# Patient Record
Sex: Female | Born: 1950 | Race: White | Hispanic: No | Marital: Married | Smoking: Never smoker
Health system: Southern US, Community
[De-identification: ages and names within clinical notes are randomized; demographics above are authoritative.]

## PROBLEM LIST (undated history)

## (undated) DIAGNOSIS — E785 Hyperlipidemia, unspecified: Secondary | ICD-10-CM

## (undated) DIAGNOSIS — I10 Essential (primary) hypertension: Secondary | ICD-10-CM

## (undated) DIAGNOSIS — M1990 Unspecified osteoarthritis, unspecified site: Secondary | ICD-10-CM

## (undated) DIAGNOSIS — H25019 Cortical age-related cataract, unspecified eye: Secondary | ICD-10-CM

## (undated) DIAGNOSIS — C801 Malignant (primary) neoplasm, unspecified: Secondary | ICD-10-CM

## (undated) DIAGNOSIS — Z9889 Other specified postprocedural states: Secondary | ICD-10-CM

## (undated) DIAGNOSIS — R112 Nausea with vomiting, unspecified: Secondary | ICD-10-CM

## (undated) DIAGNOSIS — M8580 Other specified disorders of bone density and structure, unspecified site: Secondary | ICD-10-CM

## (undated) HISTORY — PX: TUBAL LIGATION: SHX77

## (undated) HISTORY — PX: ABDOMINAL HYSTERECTOMY: SHX81

---

## 1999-11-22 DIAGNOSIS — Z86018 Personal history of other benign neoplasm: Secondary | ICD-10-CM

## 1999-11-22 HISTORY — DX: Personal history of other benign neoplasm: Z86.018

## 2000-03-23 DIAGNOSIS — Z85828 Personal history of other malignant neoplasm of skin: Secondary | ICD-10-CM

## 2000-03-23 HISTORY — DX: Personal history of other malignant neoplasm of skin: Z85.828

## 2000-05-01 DIAGNOSIS — C801 Malignant (primary) neoplasm, unspecified: Secondary | ICD-10-CM

## 2000-05-01 HISTORY — DX: Malignant (primary) neoplasm, unspecified: C80.1

## 2004-11-12 ENCOUNTER — Ambulatory Visit: Payer: Self-pay | Admitting: Unknown Physician Specialty

## 2005-11-17 ENCOUNTER — Ambulatory Visit: Payer: Self-pay | Admitting: Unknown Physician Specialty

## 2006-11-22 ENCOUNTER — Ambulatory Visit: Payer: Self-pay | Admitting: Unknown Physician Specialty

## 2007-02-13 ENCOUNTER — Ambulatory Visit: Payer: Self-pay | Admitting: Gastroenterology

## 2007-11-29 ENCOUNTER — Ambulatory Visit: Payer: Self-pay | Admitting: Unknown Physician Specialty

## 2009-02-03 ENCOUNTER — Ambulatory Visit: Payer: Self-pay | Admitting: Unknown Physician Specialty

## 2010-02-08 ENCOUNTER — Ambulatory Visit: Payer: Self-pay | Admitting: Unknown Physician Specialty

## 2011-02-23 ENCOUNTER — Ambulatory Visit: Payer: Self-pay | Admitting: Unknown Physician Specialty

## 2012-04-11 ENCOUNTER — Ambulatory Visit: Payer: Self-pay | Admitting: Unknown Physician Specialty

## 2013-04-16 ENCOUNTER — Ambulatory Visit: Payer: Self-pay | Admitting: Unknown Physician Specialty

## 2014-05-28 ENCOUNTER — Ambulatory Visit: Payer: Self-pay | Admitting: Internal Medicine

## 2015-06-25 ENCOUNTER — Other Ambulatory Visit: Payer: Self-pay | Admitting: Internal Medicine

## 2015-06-25 DIAGNOSIS — Z1231 Encounter for screening mammogram for malignant neoplasm of breast: Secondary | ICD-10-CM

## 2015-06-26 ENCOUNTER — Ambulatory Visit
Admission: RE | Admit: 2015-06-26 | Discharge: 2015-06-26 | Disposition: A | Discharge status: Home / Self Care | Payer: BLUE CROSS/BLUE SHIELD | Source: Ambulatory Visit | Attending: Internal Medicine | Admitting: Internal Medicine

## 2015-06-26 DIAGNOSIS — Z1231 Encounter for screening mammogram for malignant neoplasm of breast: Secondary | ICD-10-CM | POA: Insufficient documentation

## 2015-06-26 HISTORY — DX: Malignant (primary) neoplasm, unspecified: C80.1

## 2015-08-11 ENCOUNTER — Encounter: Payer: Self-pay | Admitting: *Deleted

## 2015-08-13 ENCOUNTER — Ambulatory Visit: Payer: BLUE CROSS/BLUE SHIELD | Admitting: Certified Registered Nurse Anesthetist

## 2015-08-13 ENCOUNTER — Ambulatory Visit
Admission: RE | Admit: 2015-08-13 | Discharge: 2015-08-13 | Disposition: A | Discharge status: Home / Self Care | Payer: BLUE CROSS/BLUE SHIELD | Source: Ambulatory Visit | Attending: Ophthalmology | Admitting: Ophthalmology

## 2015-08-13 ENCOUNTER — Encounter
Admission: RE | Disposition: A | Discharge status: Home / Self Care | Payer: Self-pay | Source: Ambulatory Visit | Attending: Ophthalmology

## 2015-08-13 ENCOUNTER — Encounter: Payer: Self-pay | Admitting: *Deleted

## 2015-08-13 DIAGNOSIS — Z85828 Personal history of other malignant neoplasm of skin: Secondary | ICD-10-CM | POA: Diagnosis not present

## 2015-08-13 DIAGNOSIS — E780 Pure hypercholesterolemia: Secondary | ICD-10-CM | POA: Diagnosis not present

## 2015-08-13 DIAGNOSIS — H269 Unspecified cataract: Secondary | ICD-10-CM | POA: Diagnosis present

## 2015-08-13 DIAGNOSIS — Z7982 Long term (current) use of aspirin: Secondary | ICD-10-CM | POA: Insufficient documentation

## 2015-08-13 DIAGNOSIS — H2511 Age-related nuclear cataract, right eye: Secondary | ICD-10-CM | POA: Insufficient documentation

## 2015-08-13 DIAGNOSIS — Z79899 Other long term (current) drug therapy: Secondary | ICD-10-CM | POA: Insufficient documentation

## 2015-08-13 DIAGNOSIS — Z882 Allergy status to sulfonamides status: Secondary | ICD-10-CM | POA: Diagnosis not present

## 2015-08-13 DIAGNOSIS — I10 Essential (primary) hypertension: Secondary | ICD-10-CM | POA: Insufficient documentation

## 2015-08-13 DIAGNOSIS — T7840XA Allergy, unspecified, initial encounter: Secondary | ICD-10-CM | POA: Diagnosis not present

## 2015-08-13 DIAGNOSIS — Z90710 Acquired absence of both cervix and uterus: Secondary | ICD-10-CM | POA: Insufficient documentation

## 2015-08-13 HISTORY — DX: Nausea with vomiting, unspecified: R11.2

## 2015-08-13 HISTORY — DX: Essential (primary) hypertension: I10

## 2015-08-13 HISTORY — PX: CATARACT EXTRACTION W/PHACO: SHX586

## 2015-08-13 HISTORY — DX: Other specified postprocedural states: Z98.890

## 2015-08-13 SURGERY — PHACOEMULSIFICATION, CATARACT, WITH IOL INSERTION
Anesthesia: Monitor Anesthesia Care | Site: Eye | Laterality: Right

## 2015-08-13 MED ORDER — TETRACAINE HCL 0.5 % OP SOLN
1.0000 [drp] | OPHTHALMIC | Status: DC | PRN
  Administered 2015-08-13: 1 [drp] via OPHTHALMIC

## 2015-08-13 MED ORDER — SODIUM CHLORIDE 0.9 % IV SOLN
INTRAVENOUS | Status: DC
  Administered 2015-08-13: 09:00:00 via INTRAVENOUS

## 2015-08-13 MED ORDER — LIDOCAINE HCL (PF) 4 % IJ SOLN
INTRAMUSCULAR | Status: AC
  Filled 2015-08-13: qty 5

## 2015-08-13 MED ORDER — EPINEPHRINE HCL 1 MG/ML IJ SOLN
INTRAMUSCULAR | Status: AC
  Filled 2015-08-13: qty 1

## 2015-08-13 MED ORDER — MOXIFLOXACIN HCL 0.5 % OP SOLN
OPHTHALMIC | Status: AC
Start: 2015-08-13 — End: 2015-08-13
  Administered 2015-08-13: 1 [drp] via OPHTHALMIC
  Filled 2015-08-13: qty 3

## 2015-08-13 MED ORDER — FENTANYL CITRATE (PF) 100 MCG/2ML IJ SOLN
INTRAMUSCULAR | Status: DC | PRN
  Administered 2015-08-13: 50 ug via INTRAVENOUS

## 2015-08-13 MED ORDER — PHENYLEPHRINE HCL 10 % OP SOLN
1.0000 [drp] | OPHTHALMIC | Status: AC | PRN
  Administered 2015-08-13 (×4): 1 [drp] via OPHTHALMIC

## 2015-08-13 MED ORDER — NA HYALUR & NA CHOND-NA HYALUR 0.4-0.35 ML IO KIT
PACK | INTRAOCULAR | Status: DC | PRN
  Administered 2015-08-13: .5 mL via INTRAOCULAR

## 2015-08-13 MED ORDER — CEFUROXIME OPHTHALMIC INJECTION 1 MG/0.1 ML
INJECTION | OPHTHALMIC | Status: AC
  Filled 2015-08-13: qty 0.1

## 2015-08-13 MED ORDER — NA HYALUR & NA CHOND-NA HYALUR 0.55-0.5 ML IO KIT
PACK | INTRAOCULAR | Status: AC
  Filled 2015-08-13: qty 1.05

## 2015-08-13 MED ORDER — PHENYLEPHRINE HCL 10 % OP SOLN
OPHTHALMIC | Status: AC
  Administered 2015-08-13: 1 [drp] via OPHTHALMIC
  Filled 2015-08-13: qty 5

## 2015-08-13 MED ORDER — CEFUROXIME OPHTHALMIC INJECTION 1 MG/0.1 ML
INJECTION | OPHTHALMIC | Status: DC | PRN
  Administered 2015-08-13: 0.1 mL via INTRACAMERAL

## 2015-08-13 MED ORDER — LIDOCAINE HCL (PF) 4 % IJ SOLN
INTRAOCULAR | Status: DC | PRN
  Administered 2015-08-13: 4 mL via OPHTHALMIC

## 2015-08-13 MED ORDER — CYCLOPENTOLATE HCL 2 % OP SOLN
OPHTHALMIC | Status: AC
  Administered 2015-08-13: 1 [drp] via OPHTHALMIC
  Filled 2015-08-13: qty 2

## 2015-08-13 MED ORDER — MIDAZOLAM HCL 2 MG/2ML IJ SOLN
INTRAMUSCULAR | Status: DC | PRN
  Administered 2015-08-13: 1 mg via INTRAVENOUS

## 2015-08-13 MED ORDER — TETRACAINE HCL 0.5 % OP SOLN
OPHTHALMIC | Status: AC
  Administered 2015-08-13: 1 [drp] via OPHTHALMIC
  Filled 2015-08-13: qty 2

## 2015-08-13 MED ORDER — CYCLOPENTOLATE HCL 2 % OP SOLN
1.0000 [drp] | OPHTHALMIC | Status: AC | PRN
  Administered 2015-08-13 (×4): 1 [drp] via OPHTHALMIC

## 2015-08-13 MED ORDER — MOXIFLOXACIN HCL 0.5 % OP SOLN
1.0000 [drp] | OPHTHALMIC | Status: AC | PRN
  Administered 2015-08-13 (×3): 1 [drp] via OPHTHALMIC

## 2015-08-13 MED ORDER — MOXIFLOXACIN HCL 0.5 % OP SOLN
OPHTHALMIC | Status: DC | PRN
  Administered 2015-08-13: 1 [drp] via OPHTHALMIC

## 2015-08-13 SURGICAL SUPPLY — 22 items
CANNULA ANT/CHMB 27GA (MISCELLANEOUS) ×3 IMPLANT
CUP MEDICINE 2OZ PLAST GRAD ST (MISCELLANEOUS) ×3 IMPLANT
GLOVE BIO SURGEON STRL SZ7 (GLOVE) ×3 IMPLANT
GLOVE SURG LX 6.5 MICRO (GLOVE) ×4
GLOVE SURG LX STRL 6.5 MICRO (GLOVE) ×2 IMPLANT
GOWN STRL REUS W/ TWL LRG LVL3 (GOWN DISPOSABLE) ×2 IMPLANT
GOWN STRL REUS W/TWL LRG LVL3 (GOWN DISPOSABLE) ×4
LENS IOL ACRSF IQ PC 19.5 (Intraocular Lens) ×1 IMPLANT
LENS IOL ACRYSOF IQ POST 19.5 (Intraocular Lens) ×3 IMPLANT
NEEDLE FILTER BLUNT 18X 1/2SAF (NEEDLE) ×2
NEEDLE FILTER BLUNT 18X1 1/2 (NEEDLE) ×1 IMPLANT
PACK CATARACT (MISCELLANEOUS) ×3 IMPLANT
PACK CATARACT BRASINGTON LX (MISCELLANEOUS) ×3 IMPLANT
PACK EYE AFTER SURG (MISCELLANEOUS) ×3 IMPLANT
SOL BSS BAG (MISCELLANEOUS) ×3
SOL PREP PVP 2OZ (MISCELLANEOUS) ×3
SOLUTION BSS BAG (MISCELLANEOUS) ×1 IMPLANT
SOLUTION PREP PVP 2OZ (MISCELLANEOUS) ×1 IMPLANT
SYR 3ML LL SCALE MARK (SYRINGE) ×6 IMPLANT
SYR TB 1ML 27GX1/2 LL (SYRINGE) ×3 IMPLANT
WATER STERILE IRR 1000ML POUR (IV SOLUTION) ×3 IMPLANT
WIPE NON LINTING 3.25X3.25 (MISCELLANEOUS) ×3 IMPLANT

## 2015-08-13 NOTE — Op Note (Signed)
  08/13/2015  PRE-OP DIAGNOSIS: Cataract (ICD-10 H25.11) Nuclear sclerotic catarct, RIGHT EYE  Post operative diagnosis: Cataract (ICD-10 H25.11) Nuclear sclerotic cataract, RIGHT EYE  Procedure: Phacoemulsification with introcular lens [id number]([id number])   SURGEON: Surgeon(s) and Role:    * Lyla Glassing, MD - Primary  ANESTHESIA: Choice   ESTIMATED BLOOD LOSS: MINIMAL  COMPLICATIONS: None  OPERATIVE DESCRIPTION:   Therapeutic options were discussed with the patient preoperatively, including a discussion of risks and benefits of surgery.  Informed consent was obtained. A dilated fundus exam was performed within 6 months.   The patient was premedicated and brought to the operating room and placed on the operating table in the supine position.  Topical tetracaine was instilled.  After adequate anesthesia, the patient was prepped and draped in the usual fashion.  A wire lid speculum was inserted and the microscope was positioned.  A sideport was used to create a paracentesis site and a mixture of preservative-free lidocaine, BSS, and epinephrine was was instilled into the anterior chamber, followed by viscoelastic.  A clear corneal incision was created using a keratome blade.  Capsulorrhexis was then performed.  In situ phacoemulsification was performed.  Cortical material was removed with the irrigation-aspiration unit.  Viscoelastic was instilled to open the capsular bag.  A posterior chamber intraocular lens, model 19.5 diopters, was inserted and positioned.  Irrigation-aspiration was used to remove all viscoelastic. Intracameral cefuroxime was injected into the eye. Wounds were checked for leakage and confirmed to be secure.  Lid speculum was removed and a shield was placed over the eye.  Patient was returned to the recovery room in stable condition. IMPLANTS:   Implant Name Type Inv. Item Serial No. Manufacturer Lot No. LRB No. Used  IMPLANT LENS - [id number] Intraocular Lens IMPLANT LENS  [id number] ALCON   Right 1     Postoperative care and discharge medication counseling was discussed with the patient or the parents prior to discharge

## 2015-08-13 NOTE — Anesthesia Procedure Notes (Signed)
Procedure Name: MAC Performed by: BEANE, CATHERINE Pre-anesthesia Checklist: Patient identified, Emergency Drugs available, Patient being monitored, Suction available and Timeout performed Oxygen Delivery Method: Nasal cannula       

## 2015-08-13 NOTE — Anesthesia Postprocedure Evaluation (Signed)
  Anesthesia Post-op Note  Patient: Chelsea Patton  Procedure(s) Performed: Procedure(s) with comments: CATARACT EXTRACTION PHACO AND INTRAOCULAR LENS PLACEMENT (IOC) (Right) - LOT # [id number] H US:01:50.8 AP:17.4 CDE:19.28  Anesthesia type:MAC  Patient location: PACU  Post pain: Pain level controlled  Post assessment: Post-op Vital signs reviewed, Patient's Cardiovascular Status Stable, Respiratory Function Stable, Patent Airway and No signs of Nausea or vomiting  Post vital signs: Reviewed and stable  Last Vitals:  Filed Vitals:   08/13/15 1028  BP: 126/65  Pulse: 88  Temp: 36.1 C  Resp: 18    Level of consciousness: awake, alert  and patient cooperative  Complications: No apparent anesthesia complications

## 2015-08-13 NOTE — Discharge Instructions (Signed)
Post Operative Instructions  Drop Schedule: Place 1 drop of Durezol in operative eye twice daily Place 1 drop of Ilevro in operative eye once daily Place 1 drop of Vigamox in operative eye four times daily  USE ONLY ONE DROP AT A TIME, WAIT 5 MINUTES BETWEEN DROPS  Eye Protection:  - Do not  rub or squeeze your eyes for one full week. - no strenuous activity or lifting more than 20 pounds during the first week - no hot tubs/saunas/swimming pools for two weeks after surgery - do not get water into your eye (washing your face is permitted but avoid water directly into the eye) - Ensure that you wear the eye shield every night for the first week after surgery   When to call:  It is unusual to have pain following cataract surgery. Pain that is not relieved with Tylenol or Ibuprofen is a reason to call immediately. Vision gradually clears during the first days after surgery; however vision should not get worse or darken. Please call with any unusual symptoms. lights, new floaters, or a curtain over the vision should prompt a phone call.    AMBULATORY SURGERY  DISCHARGE INSTRUCTIONS   1) The drugs that you were given will stay in your system until tomorrow so for the next 24 hours you should not:  A) Drive an automobile B) Make any legal decisions C) Drink any alcoholic beverage   2) You may resume regular meals tomorrow.  Today it is better to start with liquids and gradually work up to solid foods.  You may eat anything you prefer, but it is better to start with liquids, then soup and crackers, and gradually work up to solid foods.   3) Please notify your doctor immediately if you have any unusual bleeding, trouble breathing, redness and pain at the surgery site, drainage, fever, or pain not relieved by medication.    4) Additional Instructions:        Please contact your physician with any problems or Same Day Surgery at [phone], Monday through Friday 6 am to 4 pm,  or [place] at [facility] number at [phone].AMBULATORY SURGERY  DISCHARGE INSTRUCTIONS   5) The drugs that you were given will stay in your system until tomorrow so for the next 24 hours you should not:  D) Drive an automobile E) Make any legal decisions F) Drink any alcoholic beverage   6) You may resume regular meals tomorrow.  Today it is better to start with liquids and gradually work up to solid foods.  You may eat anything you prefer, but it is better to start with liquids, then soup and crackers, and gradually work up to solid foods.   7) Please notify your doctor immediately if you have any unusual bleeding, trouble breathing, redness and pain at the surgery site, drainage, fever, or pain not relieved by medication.    8) Additional Instructions:        Please contact your physician with any problems or Same Day Surgery at [phone], Monday through Friday 6 am to 4 pm, or [place] at [facility] number at [phone].

## 2015-08-13 NOTE — Anesthesia Preprocedure Evaluation (Signed)
Anesthesia Evaluation  Patient identified by MRN, date of birth, ID band Patient awake    Reviewed: Allergy & Precautions, H&P , NPO status , Patient's Chart, lab work & pertinent test results, reviewed documented beta blocker date and time   History of Anesthesia Complications (+) PONV and history of anesthetic complications  Airway Mallampati: II  TM Distance: >3 FB Neck ROM: full    Dental no notable dental hx. (+) Teeth Intact   Pulmonary neg pulmonary ROS,    Pulmonary exam normal breath sounds clear to auscultation       Cardiovascular Exercise Tolerance: Good hypertension, On Medications (-) angina(-) CAD, (-) Past MI, (-) Cardiac Stents and (-) CABG Normal cardiovascular exam(-) dysrhythmias (-) Valvular Problems/Murmurs Rhythm:regular Rate:Normal     Neuro/Psych negative neurological ROS  negative psych ROS   GI/Hepatic negative GI ROS, Neg liver ROS,   Endo/Other  negative endocrine ROS  Renal/GU negative Renal ROS  negative genitourinary   Musculoskeletal   Abdominal   Peds  Hematology negative hematology ROS (+)   Anesthesia Other Findings Past Medical History:   Cancer                                                         Comment:basal cell on nose   Hypertension                                                 PONV (postoperative nausea and vomiting)                     Reproductive/Obstetrics negative OB ROS                             Anesthesia Physical Anesthesia Plan  ASA: II  Anesthesia Plan: MAC   Post-op Pain Management:    Induction:   Airway Management Planned:   Additional Equipment:   Intra-op Plan:   Post-operative Plan:   Informed Consent: I have reviewed the patients History and Physical, chart, labs and discussed the procedure including the risks, benefits and alternatives for the proposed anesthesia with the patient or authorized  representative who has indicated his/her understanding and acceptance.   Dental Advisory Given  Plan Discussed with: Anesthesiologist, CRNA and Surgeon  Anesthesia Plan Comments:         Anesthesia Quick Evaluation

## 2015-08-13 NOTE — H&P (Signed)
The history and physical was faxed to the hospital. The history and physical was reviewed by me and no changes have occurred.   

## 2015-08-13 NOTE — Transfer of Care (Signed)
Immediate Anesthesia Transfer of Care Note  Patient: Chelsea Patton  Procedure(s) Performed: Procedure(s) with comments: CATARACT EXTRACTION PHACO AND INTRAOCULAR LENS PLACEMENT (IOC) (Right) - LOT # [id number] H US:01:50.8 AP:17.4 CDE:19.28  Patient Location: PACU  Anesthesia Type:MAC  Level of Consciousness: awake, alert  and oriented  Airway & Oxygen Therapy: Patient Spontanous Breathing  Post-op Assessment: Report given to RN and Post -op Vital signs reviewed and stable  Post vital signs: Reviewed and stable  Last Vitals:  Filed Vitals:   08/13/15 1028  BP: 126/65  Pulse: 88  Temp: 36.1 C  Resp: 18    Complications: No apparent anesthesia complications

## 2016-07-05 ENCOUNTER — Other Ambulatory Visit: Payer: Self-pay | Admitting: Internal Medicine

## 2016-07-05 DIAGNOSIS — Z1239 Encounter for other screening for malignant neoplasm of breast: Secondary | ICD-10-CM

## 2016-08-02 ENCOUNTER — Ambulatory Visit
Admission: RE | Admit: 2016-08-02 | Discharge: 2016-08-02 | Disposition: A | Discharge status: Home / Self Care | Payer: BLUE CROSS/BLUE SHIELD | Source: Ambulatory Visit | Attending: Internal Medicine | Admitting: Internal Medicine

## 2016-08-02 ENCOUNTER — Other Ambulatory Visit: Payer: Self-pay | Admitting: Internal Medicine

## 2016-08-02 DIAGNOSIS — Z1231 Encounter for screening mammogram for malignant neoplasm of breast: Secondary | ICD-10-CM | POA: Diagnosis not present

## 2016-08-02 DIAGNOSIS — Z1239 Encounter for other screening for malignant neoplasm of breast: Secondary | ICD-10-CM

## 2017-05-04 ENCOUNTER — Encounter: Payer: Self-pay | Admitting: *Deleted

## 2017-05-11 NOTE — Discharge Instructions (Signed)

## 2017-05-16 ENCOUNTER — Ambulatory Visit
Admission: RE | Admit: 2017-05-16 | Discharge: 2017-05-16 | Disposition: A | Discharge status: Home / Self Care | Payer: BLUE CROSS/BLUE SHIELD | Source: Ambulatory Visit | Attending: Ophthalmology | Admitting: Ophthalmology

## 2017-05-16 ENCOUNTER — Ambulatory Visit: Payer: BLUE CROSS/BLUE SHIELD | Admitting: Anesthesiology

## 2017-05-16 ENCOUNTER — Encounter
Admission: RE | Disposition: A | Discharge status: Home / Self Care | Payer: Self-pay | Source: Ambulatory Visit | Attending: Ophthalmology

## 2017-05-16 DIAGNOSIS — I10 Essential (primary) hypertension: Secondary | ICD-10-CM | POA: Insufficient documentation

## 2017-05-16 DIAGNOSIS — H2512 Age-related nuclear cataract, left eye: Secondary | ICD-10-CM | POA: Insufficient documentation

## 2017-05-16 DIAGNOSIS — N809 Endometriosis, unspecified: Secondary | ICD-10-CM | POA: Insufficient documentation

## 2017-05-16 HISTORY — PX: CATARACT EXTRACTION W/PHACO: SHX586

## 2017-05-16 SURGERY — PHACOEMULSIFICATION, CATARACT, WITH IOL INSERTION
Anesthesia: Monitor Anesthesia Care | Laterality: Left | Wound class: Clean

## 2017-05-16 MED ORDER — LIDOCAINE HCL (PF) 2 % IJ SOLN
INTRAMUSCULAR | Status: DC | PRN
  Administered 2017-05-16: 1 mL via INTRAOCULAR

## 2017-05-16 MED ORDER — FENTANYL CITRATE (PF) 100 MCG/2ML IJ SOLN
INTRAMUSCULAR | Status: DC | PRN
  Administered 2017-05-16: 50 ug via INTRAVENOUS

## 2017-05-16 MED ORDER — MOXIFLOXACIN HCL 0.5 % OP SOLN
OPHTHALMIC | Status: DC | PRN
  Administered 2017-05-16: 0.2 mL

## 2017-05-16 MED ORDER — ARMC OPHTHALMIC DILATING DROPS
1.0000 "application " | OPHTHALMIC | Status: DC | PRN
  Administered 2017-05-16 (×3): 1 via OPHTHALMIC

## 2017-05-16 MED ORDER — EPINEPHRINE PF 1 MG/ML IJ SOLN
INTRAOCULAR | Status: DC | PRN
  Administered 2017-05-16: 84 mL via OPHTHALMIC

## 2017-05-16 MED ORDER — LACTATED RINGERS IV SOLN: 1000.0000 mL | INTRAVENOUS | Status: DC

## 2017-05-16 MED ORDER — SODIUM HYALURONATE 23 MG/ML IO SOLN
INTRAOCULAR | Status: DC | PRN
  Administered 2017-05-16: 0.6 mL via INTRAOCULAR

## 2017-05-16 MED ORDER — MIDAZOLAM HCL 2 MG/2ML IJ SOLN
INTRAMUSCULAR | Status: DC | PRN
  Administered 2017-05-16: 2 mg via INTRAVENOUS

## 2017-05-16 MED ORDER — SODIUM HYALURONATE 10 MG/ML IO SOLN
INTRAOCULAR | Status: DC | PRN
  Administered 2017-05-16: 0.55 mL via INTRAOCULAR

## 2017-05-16 SURGICAL SUPPLY — 16 items
CANNULA ANT/CHMB 27GA (MISCELLANEOUS) ×3 IMPLANT
DISSECTOR HYDRO NUCLEUS 50X22 (MISCELLANEOUS) ×3 IMPLANT
GLOVE BIO SURGEON STRL SZ8 (GLOVE) ×3 IMPLANT
GLOVE SURG LX 7.5 STRW (GLOVE) ×2
GLOVE SURG LX STRL 7.5 STRW (GLOVE) ×1 IMPLANT
GOWN STRL REUS W/ TWL LRG LVL3 (GOWN DISPOSABLE) ×2 IMPLANT
GOWN STRL REUS W/TWL LRG LVL3 (GOWN DISPOSABLE) ×4
LENS IOL TECNIS ITEC 18.0 (Intraocular Lens) ×3 IMPLANT
MARKER SKIN DUAL TIP RULER LAB (MISCELLANEOUS) ×3 IMPLANT
PACK CATARACT (MISCELLANEOUS) ×3 IMPLANT
PACK DR. KING ARMS (PACKS) ×3 IMPLANT
PACK EYE AFTER SURG (MISCELLANEOUS) ×3 IMPLANT
SYR 3ML LL SCALE MARK (SYRINGE) ×3 IMPLANT
SYR TB 1ML LUER SLIP (SYRINGE) ×3 IMPLANT
WATER STERILE IRR 500ML POUR (IV SOLUTION) ×3 IMPLANT
WIPE NON LINTING 3.25X3.25 (MISCELLANEOUS) ×3 IMPLANT

## 2017-05-16 NOTE — Anesthesia Postprocedure Evaluation (Signed)
Anesthesia Post Note  Patient: Chelsea Patton  Procedure(s) Performed: Procedure(s) (LRB): CATARACT EXTRACTION PHACO AND INTRAOCULAR LENS PLACEMENT (IOC)  left (Left)  Patient location during evaluation: PACU Anesthesia Type: MAC Level of consciousness: awake Pain management: pain level controlled Vital Signs Assessment: post-procedure vital signs reviewed and stable Respiratory status: spontaneous breathing Cardiovascular status: blood pressure returned to baseline Postop Assessment: no headache Anesthetic complications: no    Lavonna Monarch

## 2017-05-16 NOTE — Transfer of Care (Signed)
Immediate Anesthesia Transfer of Care Note  Patient: Chelsea Patton  Procedure(s) Performed: Procedure(s): CATARACT EXTRACTION PHACO AND INTRAOCULAR LENS PLACEMENT (IOC)  left (Left)  Patient Location: PACU  Anesthesia Type: MAC  Level of Consciousness: awake, alert  and patient cooperative  Airway and Oxygen Therapy: Patient Spontanous Breathing and Patient connected to supplemental oxygen  Post-op Assessment: Post-op Vital signs reviewed, Patient's Cardiovascular Status Stable, Respiratory Function Stable, Patent Airway and No signs of Nausea or vomiting  Post-op Vital Signs: Reviewed and stable  Complications: No apparent anesthesia complications

## 2017-05-16 NOTE — H&P (Signed)
The History and Physical notes are on paper, have been signed, and are to be scanned.   I have examined the patient and there are no changes to the H&P.   [name] 05/16/2017 8:50 AM

## 2017-05-16 NOTE — Op Note (Signed)
OPERATIVE NOTE  Chelsea Patton [id number] 05/16/2017   PREOPERATIVE DIAGNOSIS:  Nuclear sclerotic cataract left eye.  H25.12   POSTOPERATIVE DIAGNOSIS:    Nuclear sclerotic cataract left eye.     PROCEDURE:  Phacoemusification with posterior chamber intraocular lens placement of the left eye   LENS:   Implant Name Type Inv. Item Serial No. Manufacturer Lot No. LRB No. Used  LENS IOL DIOP 18.0 - [id number] Intraocular Lens LENS IOL DIOP 18.0 [id number]   Left 1       PCB00 +18.0   ULTRASOUND TIME: 0 minutes 53 seconds.  CDE 8.58   SURGEON:  [name], MD, MPH   ANESTHESIA:  Topical with tetracaine drops augmented with 1% preservative-free intracameral lidocaine.  ESTIMATED BLOOD LOSS: <1 mL   COMPLICATIONS:  None.   DESCRIPTION OF PROCEDURE:  The patient was identified in the holding room and transported to the operating room and placed in the supine position under the operating microscope.  The left eye was identified as the operative eye and it was prepped and draped in the usual sterile ophthalmic fashion.   A 1.0 millimeter clear-corneal paracentesis was made at the 5:00 position. 0.5 ml of preservative-free 1% lidocaine with epinephrine was injected into the anterior chamber.  The anterior chamber was filled with Healon 5 viscoelastic.  A 2.4 millimeter keratome was used to make a near-clear corneal incision at the 2:00 position.  A curvilinear capsulorrhexis was made with a cystotome and capsulorrhexis forceps.  Balanced salt solution was used to hydrodissect and hydrodelineate the nucleus.   Phacoemulsification was then used in stop and chop fashion to remove the lens nucleus and epinucleus.  The remaining cortex was then removed using the irrigation and aspiration handpiece. Healon was then placed into the capsular bag to distend it for lens placement.  A lens was then injected into the capsular bag.  The remaining viscoelastic was aspirated.   Wounds were hydrated  with balanced salt solution.  The anterior chamber was inflated to a physiologic pressure with balanced salt solution.  Intracameral vigamox 0.1 mL undiltued was injected into the eye and a drop placed onto the ocular surface.  No wound leaks were noted.  The patient was taken to the recovery room in stable condition without complications of anesthesia or surgery  [name] 05/16/2017, 9:29 AM

## 2017-05-16 NOTE — Anesthesia Preprocedure Evaluation (Addendum)
Anesthesia Evaluation  Patient identified by MRN, date of birth, ID band Patient awake    Reviewed: Allergy & Precautions, NPO status , Patient's Chart, lab work & pertinent test results, reviewed documented beta blocker date and time   History of Anesthesia Complications (+) PONV  Airway Mallampati: II  TM Distance: >3 FB Neck ROM: Full    Dental no notable dental hx.    Pulmonary neg pulmonary ROS,    Pulmonary exam normal breath sounds clear to auscultation       Cardiovascular hypertension, Normal cardiovascular exam Rhythm:Regular Rate:Normal     Neuro/Psych negative neurological ROS     GI/Hepatic negative GI ROS, Neg liver ROS,   Endo/Other  negative endocrine ROS  Renal/GU negative Renal ROS  negative genitourinary   Musculoskeletal negative musculoskeletal ROS (+)   Abdominal Normal abdominal exam  (+)  Abdomen: soft.    Peds  Hematology negative hematology ROS (+)   Anesthesia Other Findings   Reproductive/Obstetrics Endometriosis                            Anesthesia Physical Anesthesia Plan  ASA: II  Anesthesia Plan: MAC   Post-op Pain Management:    Induction:   PONV Risk Score and Plan:   Airway Management Planned: Nasal Cannula  Additional Equipment: None  Intra-op Plan:   Post-operative Plan:   Informed Consent: I have reviewed the patients History and Physical, chart, labs and discussed the procedure including the risks, benefits and alternatives for the proposed anesthesia with the patient or authorized representative who has indicated his/her understanding and acceptance.     Plan Discussed with: CRNA, Anesthesiologist and Surgeon  Anesthesia Plan Comments:         Anesthesia Quick Evaluation

## 2017-05-16 NOTE — Anesthesia Procedure Notes (Signed)
Procedure Name: MAC Performed by: Bryonna Sundby Pre-anesthesia Checklist: Patient identified, Emergency Drugs available, Suction available, Timeout performed and Patient being monitored Patient Re-evaluated:Patient Re-evaluated prior to inductionOxygen Delivery Method: Nasal cannula Placement Confirmation: positive ETCO2     

## 2017-05-17 ENCOUNTER — Encounter: Payer: Self-pay | Admitting: Ophthalmology

## 2017-08-09 ENCOUNTER — Other Ambulatory Visit: Payer: Self-pay | Admitting: Internal Medicine

## 2017-08-09 DIAGNOSIS — Z1231 Encounter for screening mammogram for malignant neoplasm of breast: Secondary | ICD-10-CM

## 2017-08-18 ENCOUNTER — Ambulatory Visit
Admission: RE | Admit: 2017-08-18 | Discharge: 2017-08-18 | Disposition: A | Discharge status: Home / Self Care | Payer: BLUE CROSS/BLUE SHIELD | Source: Ambulatory Visit | Attending: Internal Medicine | Admitting: Internal Medicine

## 2017-08-18 DIAGNOSIS — Z1231 Encounter for screening mammogram for malignant neoplasm of breast: Secondary | ICD-10-CM | POA: Diagnosis not present

## 2017-09-01 ENCOUNTER — Other Ambulatory Visit: Payer: Self-pay | Admitting: Internal Medicine

## 2017-09-01 DIAGNOSIS — R3129 Other microscopic hematuria: Secondary | ICD-10-CM

## 2017-09-12 ENCOUNTER — Ambulatory Visit (INDEPENDENT_AMBULATORY_CARE_PROVIDER_SITE_OTHER): Payer: BLUE CROSS/BLUE SHIELD | Admitting: Urology

## 2017-09-12 ENCOUNTER — Encounter: Payer: Self-pay | Admitting: Urology

## 2017-09-12 VITALS — BP 133/83 | HR 98 | Ht 63.0 in | Wt 122.4 lb

## 2017-09-12 DIAGNOSIS — R3129 Other microscopic hematuria: Secondary | ICD-10-CM

## 2017-09-12 LAB — MICROSCOPIC EXAMINATION
BACTERIA UA: NONE SEEN
Epithelial Cells (non renal): NONE SEEN /hpf (ref 0–10)
WBC UA: NONE SEEN /HPF (ref 0–?)

## 2017-09-12 LAB — URINALYSIS, COMPLETE
Bilirubin, UA: NEGATIVE
Glucose, UA: NEGATIVE
Ketones, UA: NEGATIVE
Nitrite, UA: NEGATIVE
PH UA: 7 (ref 5.0–7.5)
PROTEIN UA: NEGATIVE
Specific Gravity, UA: 1.015 (ref 1.005–1.030)
UUROB: 0.2 mg/dL (ref 0.2–1.0)

## 2017-09-12 NOTE — Progress Notes (Signed)
09/12/2017 8:30 AM   Chelsea Patton 09-09-1951 [id number]  Referring provider: Glendon Axe, MD [facility] [place], [place] [address]  HPI:  1. Microscopic hematuria - blood on UA noted x several by PCP, GYN for over 40 years. No gross episodes. Non smoker. No chronic solvent exposure. No severre HTN or sig proteinuria. She has been through at least 4 prior negative office cystoscopies and several CTs and renal US's per report that have been unremarkable.   PMH sig for benign hyst, HTN, HLD.  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  Her PCP is Glendon Axe MD with [facility].  Today "Chelsea Patton" is seen as new patient for above, referred by dr. Candiss Norse.    PMH: Past Medical History:  Diagnosis Date  . Cancer ([place])    basal cell on nose  . Hypertension   . PONV (postoperative nausea and vomiting)     Surgical History: Past Surgical History:  Procedure Laterality Date  . ABDOMINAL HYSTERECTOMY    . CATARACT EXTRACTION W/PHACO Right 08/13/2015   Procedure: CATARACT EXTRACTION PHACO AND INTRAOCULAR LENS PLACEMENT (IOC);  Surgeon: Lyla Glassing, MD;  Location: [facility] ORS;  Service: Ophthalmology;  Laterality: Right;  LOT # [id number] H US:01:50.8 AP:17.4 CDE:19.28  . CATARACT EXTRACTION W/PHACO Left 05/16/2017   Procedure: CATARACT EXTRACTION PHACO AND INTRAOCULAR LENS PLACEMENT ([place])  left;  Surgeon: Eulogio Bear, MD;  Location: [facility];  Service: Ophthalmology;  Laterality: Left;  . TUBAL LIGATION      Home Medications:  Allergies as of 09/12/2017      Reactions   Codeine Nausea Only   Sulfa Antibiotics Rash      Medication List       Accurate as of 09/12/17  8:30 AM. Always use your most recent med list.          acetaminophen 325 MG tablet Commonly known as:  TYLENOL Take 650 mg by mouth every 6 (six) hours as needed.   aspirin EC 81 MG tablet Take 81 mg by mouth daily.   calcium carbonate 1250 (500 Ca)  MG tablet Commonly known as:  OS-CAL - dosed in mg of elemental calcium Take 1 tablet by mouth.   cholecalciferol 400 units Tabs tablet Commonly known as:  VITAMIN D Take 600 Units by mouth.   diphenhydrAMINE 25 MG tablet Commonly known as:  BENADRYL Take 25 mg by mouth every 6 (six) hours as needed.   hydrochlorothiazide 25 MG tablet Commonly known as:  HYDRODIURIL Take 25 mg by mouth daily.   ibuprofen 200 MG tablet Commonly known as:  ADVIL,MOTRIN Take 200 mg by mouth every 6 (six) hours as needed.   rosuvastatin 10 MG tablet Commonly known as:  CRESTOR Take 10 mg by mouth daily.   SYSTANE 0.4-0.3 % Soln Generic drug:  Polyethyl Glycol-Propyl Glycol Apply to eye.       Allergies:  Allergies  Allergen Reactions  . Codeine Nausea Only  . Sulfa Antibiotics Rash    Family History: Family History  Problem Relation Age of Onset  . Breast cancer Maternal Grandmother 82    Social History:  reports that she has never smoked. She has never used smokeless tobacco. She reports that she does not drink alcohol. Her drug history is not on file.     Review of Systems  Gastrointestinal (upper)  : Negative for upper GI symptoms  Gastrointestinal (lower) : Negative for lower GI symptoms  Constitutional : Negative  for symptoms  Skin: Negative for skin symptoms  Eyes: Negative for eye symptoms  Ear/Nose/Throat : Negative for Ear/Nose/Throat symptoms  Hematologic/Lymphatic: Negative for Hematologic/Lymphatic symptoms  Cardiovascular : Negative for cardiovascular symptoms  Respiratory : Negative for respiratory symptoms  Endocrine: Negative for endocrine symptoms  Musculoskeletal: Negative for musculoskeletal symptoms  Neurological: Negative for neurological symptoms  Psychologic: Negative for psychiatric symptoms   Physical Exam: There were no vitals taken for this visit.  Constitutional:  Alert and oriented, No acute distress. Very pleasant  and vigorous for age.  HEENT: [place] AT, moist mucus membranes.  Trachea midline, no masses. Cardiovascular: No clubbing, cyanosis, or edema. Respiratory: Normal respiratory effort, no increased work of breathing. GI: Abdomen is soft, nontender, nondistended, no abdominal masses GU: No CVA tenderness.  Skin: No rashes, bruises or suspicious lesions. Lymph: No cervical or inguinal adenopathy. Neurologic: Grossly intact, no focal deficits, moving all 4 extremities. Psychiatric: Normal mood and affect.  Laboratory Data: No results found for: WBC, HGB, HCT, MCV, PLT  No results found for: CREATININE  No results found for: PSA1  No results found for: TESTOSTERONE  No results found for: HGBA1C  Urinalysis No results found for: SPECGRAV, PHUR, COLORU, APPEARANCEUR, LEUKOCYTESUR, PROTEINUR, GLUCOSEU, KETONESU, RBCU, BILIRUBINUR, UUROB, NITRITE  No results found for: LABMICR, WBCUA, RBCUA, LABEPIT, MUCUS, BACTERIA  Pertinent Imaging: None  Assessment & Plan:    1. Microscopic hematuria - we discussed DDX benign and malignant etiologies and rec eval with labs, exam, CT, cysto with goal of ruling out worrisome causes. She is low risk and has been through this evaluation multiple times prior. No severe HTN or large proteinuria. I do not feel that further evaluation is necessary. Strongly reinforced that should hematuria ever become visible, then repeat eval would be warranted. She voiced understanding.  RTC Urol PRN.     Alexis Frock, [facility] [address] [place], [place] [address] [phone]

## 2017-09-18 ENCOUNTER — Ambulatory Visit: Payer: BLUE CROSS/BLUE SHIELD

## 2017-11-22 ENCOUNTER — Encounter: Payer: Self-pay | Admitting: *Deleted

## 2017-11-23 ENCOUNTER — Ambulatory Visit
Admission: RE | Admit: 2017-11-23 | Discharge: 2017-11-23 | Disposition: A | Discharge status: Home / Self Care | Payer: BLUE CROSS/BLUE SHIELD | Source: Ambulatory Visit | Attending: Gastroenterology | Admitting: Gastroenterology

## 2017-11-23 ENCOUNTER — Encounter
Admission: RE | Disposition: A | Discharge status: Home / Self Care | Payer: Self-pay | Source: Ambulatory Visit | Attending: Gastroenterology

## 2017-11-23 ENCOUNTER — Ambulatory Visit: Payer: BLUE CROSS/BLUE SHIELD | Admitting: Anesthesiology

## 2017-11-23 DIAGNOSIS — Z882 Allergy status to sulfonamides status: Secondary | ICD-10-CM | POA: Diagnosis not present

## 2017-11-23 DIAGNOSIS — Q438 Other specified congenital malformations of intestine: Secondary | ICD-10-CM | POA: Diagnosis not present

## 2017-11-23 DIAGNOSIS — Z885 Allergy status to narcotic agent status: Secondary | ICD-10-CM | POA: Insufficient documentation

## 2017-11-23 DIAGNOSIS — D123 Benign neoplasm of transverse colon: Secondary | ICD-10-CM | POA: Diagnosis not present

## 2017-11-23 DIAGNOSIS — Z79899 Other long term (current) drug therapy: Secondary | ICD-10-CM | POA: Insufficient documentation

## 2017-11-23 DIAGNOSIS — Z85828 Personal history of other malignant neoplasm of skin: Secondary | ICD-10-CM | POA: Insufficient documentation

## 2017-11-23 DIAGNOSIS — E785 Hyperlipidemia, unspecified: Secondary | ICD-10-CM | POA: Diagnosis not present

## 2017-11-23 DIAGNOSIS — Z7982 Long term (current) use of aspirin: Secondary | ICD-10-CM | POA: Diagnosis not present

## 2017-11-23 DIAGNOSIS — Z1211 Encounter for screening for malignant neoplasm of colon: Secondary | ICD-10-CM | POA: Diagnosis not present

## 2017-11-23 DIAGNOSIS — M1990 Unspecified osteoarthritis, unspecified site: Secondary | ICD-10-CM | POA: Diagnosis not present

## 2017-11-23 DIAGNOSIS — Z8719 Personal history of other diseases of the digestive system: Secondary | ICD-10-CM | POA: Diagnosis not present

## 2017-11-23 DIAGNOSIS — I10 Essential (primary) hypertension: Secondary | ICD-10-CM | POA: Insufficient documentation

## 2017-11-23 DIAGNOSIS — M8580 Other specified disorders of bone density and structure, unspecified site: Secondary | ICD-10-CM | POA: Insufficient documentation

## 2017-11-23 HISTORY — PX: COLONOSCOPY WITH PROPOFOL: SHX5780

## 2017-11-23 HISTORY — DX: Other specified disorders of bone density and structure, unspecified site: M85.80

## 2017-11-23 HISTORY — DX: Unspecified osteoarthritis, unspecified site: M19.90

## 2017-11-23 HISTORY — DX: Hyperlipidemia, unspecified: E78.5

## 2017-11-23 HISTORY — DX: Cortical age-related cataract, unspecified eye: H25.019

## 2017-11-23 SURGERY — COLONOSCOPY WITH PROPOFOL
Anesthesia: General

## 2017-11-23 MED ORDER — LIDOCAINE 2% (20 MG/ML) 5 ML SYRINGE
INTRAMUSCULAR | Status: DC | PRN
  Administered 2017-11-23: 40 mg via INTRAVENOUS

## 2017-11-23 MED ORDER — FENTANYL CITRATE (PF) 100 MCG/2ML IJ SOLN
INTRAMUSCULAR | Status: AC
  Filled 2017-11-23: qty 2

## 2017-11-23 MED ORDER — SODIUM CHLORIDE 0.9 % IV SOLN: INTRAVENOUS | Status: DC

## 2017-11-23 MED ORDER — SODIUM CHLORIDE 0.9 % IV SOLN
INTRAVENOUS | Status: DC
  Administered 2017-11-23: 09:00:00 via INTRAVENOUS

## 2017-11-23 MED ORDER — PROPOFOL 500 MG/50ML IV EMUL
INTRAVENOUS | Status: DC | PRN
  Administered 2017-11-23: 140 ug/kg/min via INTRAVENOUS

## 2017-11-23 MED ORDER — PHENYLEPHRINE HCL 10 MG/ML IJ SOLN
INTRAMUSCULAR | Status: DC | PRN
  Administered 2017-11-23 (×3): 100 ug via INTRAVENOUS

## 2017-11-23 MED ORDER — PROPOFOL 500 MG/50ML IV EMUL
INTRAVENOUS | Status: AC
  Filled 2017-11-23: qty 50

## 2017-11-23 MED ORDER — FENTANYL CITRATE (PF) 100 MCG/2ML IJ SOLN
INTRAMUSCULAR | Status: DC | PRN
  Administered 2017-11-23 (×2): 50 ug via INTRAVENOUS

## 2017-11-23 MED ORDER — PROPOFOL 10 MG/ML IV BOLUS
INTRAVENOUS | Status: DC | PRN
  Administered 2017-11-23: 100 mg via INTRAVENOUS

## 2017-11-23 NOTE — Anesthesia Post-op Follow-up Note (Signed)
Anesthesia QCDR form completed.        

## 2017-11-23 NOTE — Op Note (Signed)
[facility] Gastroenterology Patient Name: Chelsea Patton Procedure Date: 11/23/2017 9:30 AM MRN: [id number] Account #: [id number] Date of Birth: [birth year] Admit Type: Outpatient Age: 67 Room: [facility] ENDO ROOM 1 Gender: Female Note Status: Finalized Procedure:            Colonoscopy Indications:          Screening for colorectal malignant neoplasm Providers:            Lollie Sails, MD Referring MD:         Glendon Axe (Referring MD) Medicines:            Monitored Anesthesia Care Complications:        No immediate complications. Procedure:            Pre-Anesthesia Assessment:                       - ASA Grade Assessment: II - A patient with mild                        systemic disease.                       After obtaining informed consent, the colonoscope was                        passed under direct vision. Throughout the procedure,                        the patient's blood pressure, pulse, and oxygen                        saturations were monitored continuously. The                        Colonoscope was introduced through the anus and                        advanced to the the cecum, identified by appendiceal                        orifice and ileocecal valve. The colonoscopy was                        unusually difficult due to significant looping and a                        tortuous colon. Successful completion of the procedure                        was aided by changing the patient to a supine position,                        changing the patient to a prone position and using                        manual pressure. The patient tolerated the procedure                        well. The quality of the bowel preparation was good. Findings:      A 3 mm polyp was  found in the transverse colon. The polyp was sessile.       The polyp was removed with a cold biopsy forceps. Resection and       retrieval were complete.      The sigmoid colon,  descending colon and transverse colon were       significantly tortuous.      The exam was otherwise normal throughout the examined colon.      The digital rectal exam was normal. Impression:           - One 3 mm polyp in the transverse colon, removed with                        a cold biopsy forceps. Resected and retrieved.                       - Tortuous colon. Recommendation:       - Discharge patient to home.                       - Telephone GI clinic for pathology results in 1 week. Procedure Code(s):    --- Professional ---                       [phone], Colonoscopy, flexible; with biopsy, single or                        multiple Diagnosis Code(s):    --- Professional ---                       Z12.11, Encounter for screening for malignant neoplasm                        of colon                       D12.3, Benign neoplasm of transverse colon (hepatic                        flexure or splenic flexure)                       Q43.8, Other specified congenital malformations of                        intestine CPT copyright 2016 American Medical Association. All rights reserved. The codes documented in this report are preliminary and upon coder review may  be revised to meet current compliance requirements. Lollie Sails, MD 11/23/2017 10:22:38 AM This report has been signed electronically. Number of Addenda: 0 Note Initiated On: 11/23/2017 9:30 AM Scope Withdrawal Time: 0 hours 7 minutes 19 seconds  Total Procedure Duration: 0 hours 37 minutes 28 seconds       [facility]

## 2017-11-23 NOTE — Transfer of Care (Signed)
Immediate Anesthesia Transfer of Care Note  Patient: Chelsea Patton  Procedure(s) Performed: COLONOSCOPY WITH PROPOFOL (N/A )  Patient Location: PACU and Endoscopy Unit  Anesthesia Type:General  Level of Consciousness: sedated  Airway & Oxygen Therapy: Patient Spontanous Breathing and Patient connected to face mask oxygen  Post-op Assessment: Report given to RN and Post -op Vital signs reviewed and stable  Post vital signs: Reviewed and stable  Last Vitals:  Vitals:   11/23/17 1025 11/23/17 1026  BP:  (!) 96/45  Pulse: 76 76  Resp: 20 18  Temp: (!) 35.8 C   SpO2:  100%    Last Pain:  Vitals:   11/23/17 1025  TempSrc: Tympanic         Complications: No apparent anesthesia complications

## 2017-11-23 NOTE — H&P (Signed)
Outpatient short stay form Pre-procedure 11/23/2017 9:31 AM Lollie Sails MD  Primary Physician: Dr. Glendon Axe  Reason for visit:  Colonoscopy  History of present illness:  Patient is a 67 year old female presenting today as above. Her last colonoscopy was about 10 years ago. At that time she had some diverticulosis negative findings otherwise. She tolerated her prep well. She does take a daily 81 mg aspirin the last time yesterday. She takes no other aspirin products or blood thinning agents.     Current Facility-Administered Medications:  .  0.9 %  sodium chloride infusion, , Intravenous, Continuous, Lollie Sails, MD, Last Rate: 20 mL/hr at 11/23/17 0858 .  0.9 %  sodium chloride infusion, , Intravenous, Continuous, Lollie Sails, MD  Medications Prior to Admission  Medication Sig Dispense Refill Last Dose  . acetaminophen (TYLENOL) 325 MG tablet Take 650 mg by mouth every 6 (six) hours as needed.   Past Month at Unknown time  . aspirin EC 81 MG tablet Take 81 mg by mouth daily.   11/22/2017 at Unknown time  . calcium carbonate (OS-CAL - DOSED IN MG OF ELEMENTAL CALCIUM) 1250 (500 Ca) MG tablet Take 1 tablet by mouth.   Past Week at Unknown time  . cholecalciferol (VITAMIN D) 400 units TABS tablet Take 600 Units by mouth.   Past Week at Unknown time  . hydrochlorothiazide (HYDRODIURIL) 25 MG tablet Take 25 mg by mouth daily.   11/22/2017 at Unknown time  . Menaquinone-7 (VITAMIN K2) 40 MCG TABS Take by mouth.   Past Week at Unknown time  . Polyethyl Glycol-Propyl Glycol (SYSTANE) 0.4-0.3 % SOLN Apply to eye.   11/22/2017 at Unknown time  . rosuvastatin (CRESTOR) 10 MG tablet Take 10 mg by mouth daily.   11/22/2017 at Unknown time  . diphenhydrAMINE (BENADRYL) 25 MG tablet Take 25 mg by mouth every 6 (six) hours as needed.   Not Taking at Unknown time  . ibuprofen (ADVIL,MOTRIN) 200 MG tablet Take 200 mg by mouth every 6 (six) hours as needed.   08/23/2017     Allergies   Allergen Reactions  . Codeine Nausea Only  . Sulfa Antibiotics Rash     Past Medical History:  Diagnosis Date  . Cancer ([place])    basal cell on nose  . Cataract cortical, senile   . Degenerative joint disease   . Hyperlipidemia   . Hypertension   . Osteopenia   . PONV (postoperative nausea and vomiting)     Review of systems:      Physical Exam    Heart and lungs: Regular rate and rhythm without rub or gallop, lungs are bilaterally clear.    HEENT: Normocephalic atraumatic eyes are anicteric    Other:     Pertinant exam for procedure: Soft nontender nondistended bowel sounds positive normoactive    Planned proceedures: Colonoscopy and indicated procedures. I have discussed the risks benefits and complications of procedures to include not limited to bleeding, infection, perforation and the risk of sedation and the patient wishes to proceed.    Lollie Sails, MD Gastroenterology 11/23/2017  9:31 AM

## 2017-11-23 NOTE — Anesthesia Preprocedure Evaluation (Signed)
Anesthesia Evaluation  Patient identified by MRN, date of birth, ID band Patient awake    Reviewed: Allergy & Precautions, H&P , NPO status , Patient's Chart, lab work & pertinent test results, reviewed documented beta blocker date and time   History of Anesthesia Complications (+) PONV and history of anesthetic complications  Airway Mallampati: II  TM Distance: >3 FB Neck ROM: full    Dental no notable dental hx. (+) Teeth Intact   Pulmonary neg pulmonary ROS,    Pulmonary exam normal breath sounds clear to auscultation       Cardiovascular Exercise Tolerance: Good hypertension, On Medications (-) angina(-) CAD, (-) Past MI, (-) Cardiac Stents and (-) CABG Normal cardiovascular exam(-) dysrhythmias (-) Valvular Problems/Murmurs Rhythm:regular Rate:Normal     Neuro/Psych negative neurological ROS  negative psych ROS   GI/Hepatic negative GI ROS, Neg liver ROS,   Endo/Other  negative endocrine ROS  Renal/GU negative Renal ROS  negative genitourinary   Musculoskeletal   Abdominal   Peds  Hematology negative hematology ROS (+)   Anesthesia Other Findings Past Medical History:   Cancer                                                         Comment:basal cell on nose   Hypertension                                                 PONV (postoperative nausea and vomiting)                     Reproductive/Obstetrics negative OB ROS                             Anesthesia Physical  Anesthesia Plan  ASA: II  Anesthesia Plan: General   Post-op Pain Management:    Induction: Intravenous  PONV Risk Score and Plan: 4 or greater and Propofol infusion  Airway Management Planned: Nasal Cannula  Additional Equipment:   Intra-op Plan:   Post-operative Plan:   Informed Consent: I have reviewed the patients History and Physical, chart, labs and discussed the procedure including the risks,  benefits and alternatives for the proposed anesthesia with the patient or authorized representative who has indicated his/her understanding and acceptance.   Dental Advisory Given  Plan Discussed with: Anesthesiologist, CRNA and Surgeon  Anesthesia Plan Comments:         Anesthesia Quick Evaluation

## 2017-11-24 ENCOUNTER — Encounter: Payer: Self-pay | Admitting: Gastroenterology

## 2017-11-24 LAB — SURGICAL PATHOLOGY

## 2017-11-24 NOTE — Anesthesia Postprocedure Evaluation (Signed)
Anesthesia Post Note  Patient: Chelsea Patton  Procedure(s) Performed: COLONOSCOPY WITH PROPOFOL (N/A )  Patient location during evaluation: Endoscopy Anesthesia Type: General Level of consciousness: awake and alert Pain management: pain level controlled Vital Signs Assessment: post-procedure vital signs reviewed and stable Respiratory status: spontaneous breathing, nonlabored ventilation, respiratory function stable and patient connected to nasal cannula oxygen Cardiovascular status: blood pressure returned to baseline and stable Postop Assessment: no apparent nausea or vomiting Anesthetic complications: no     Last Vitals:  Vitals:   11/23/17 1050 11/23/17 1059  BP:  116/75  Pulse: 75 76  Resp: 15 12  Temp:    SpO2: 100% 99%    Last Pain:  Vitals:   11/23/17 1025  TempSrc: Tympanic                 Martha Clan

## 2018-09-12 ENCOUNTER — Other Ambulatory Visit: Payer: Self-pay | Admitting: Internal Medicine

## 2018-09-12 DIAGNOSIS — Z1231 Encounter for screening mammogram for malignant neoplasm of breast: Secondary | ICD-10-CM

## 2018-10-02 ENCOUNTER — Ambulatory Visit
Admission: RE | Admit: 2018-10-02 | Discharge: 2018-10-02 | Disposition: A | Discharge status: Home / Self Care | Payer: BLUE CROSS/BLUE SHIELD | Source: Ambulatory Visit | Attending: Internal Medicine | Admitting: Internal Medicine

## 2018-10-02 DIAGNOSIS — Z1231 Encounter for screening mammogram for malignant neoplasm of breast: Secondary | ICD-10-CM | POA: Diagnosis present

## 2019-05-26 IMAGING — MG 2D DIGITAL SCREENING BILATERAL MAMMOGRAM WITH CAD AND ADJUNCT TO
9 of 12 series · 9 of 28 positions shown · non-contrast
Comparison: Previous exam(s).

CLINICAL DATA: Screening.

EXAM:
2D DIGITAL SCREENING BILATERAL MAMMOGRAM WITH CAD AND ADJUNCT TOMO

[R MLO synth-2D]
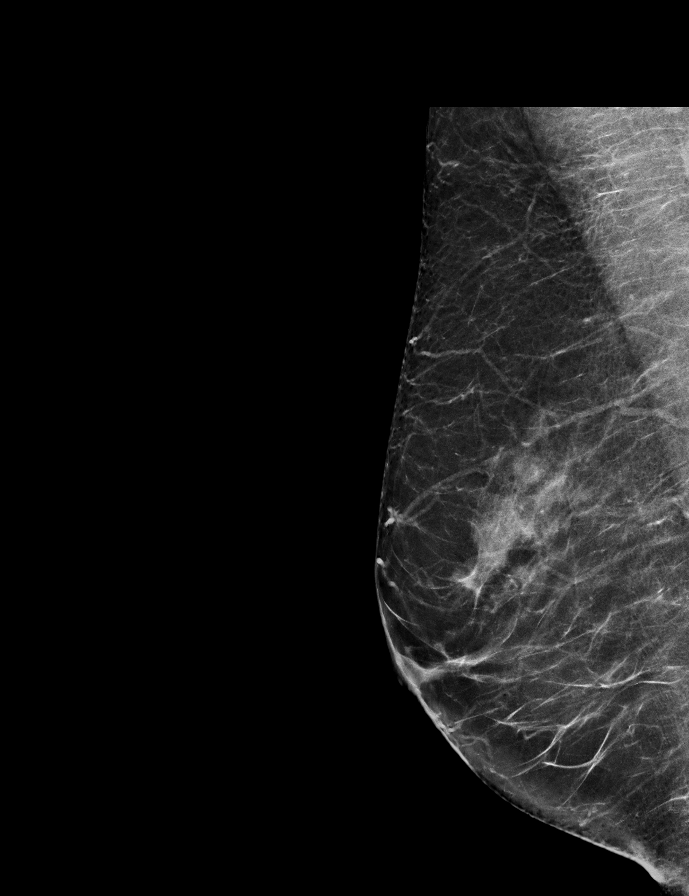

[L CC synth-2D]
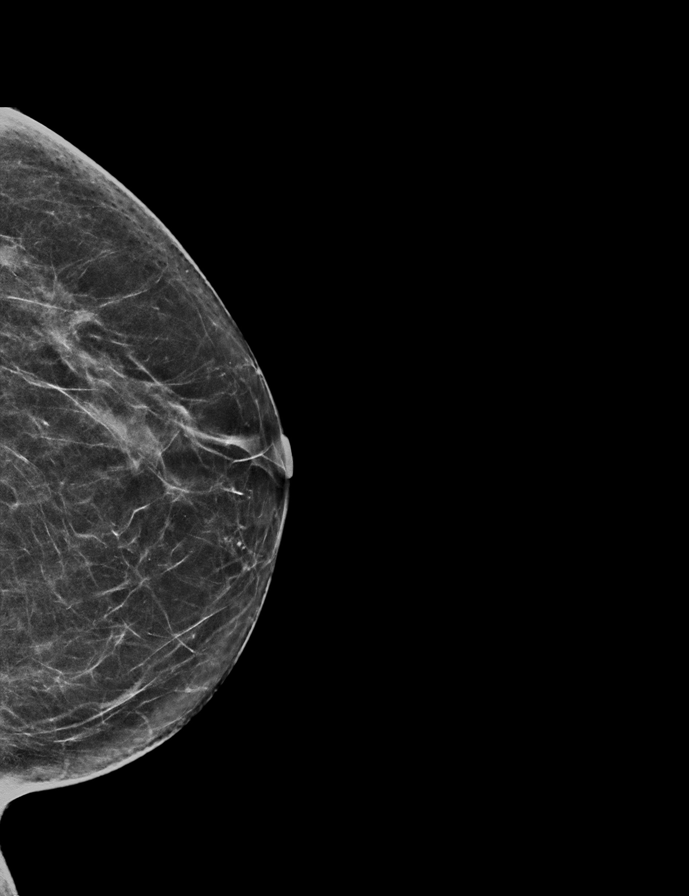

[L MLO]
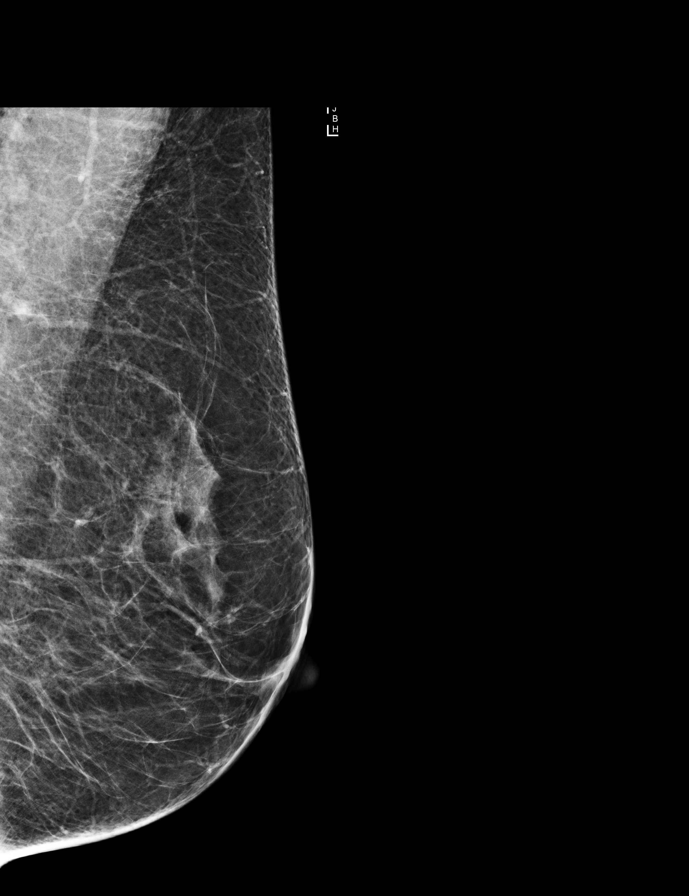

[R CC]
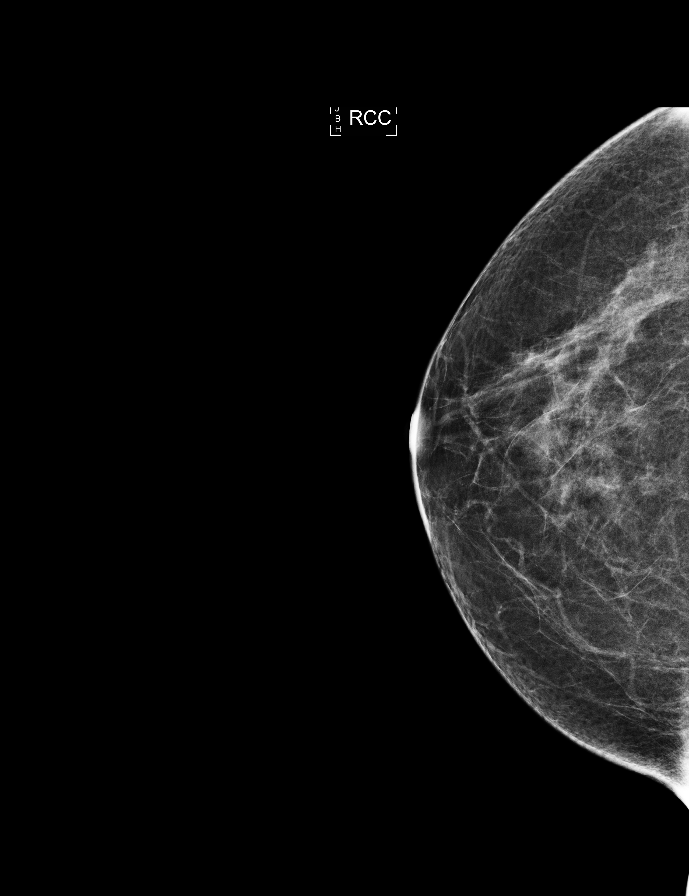

[R MLO]
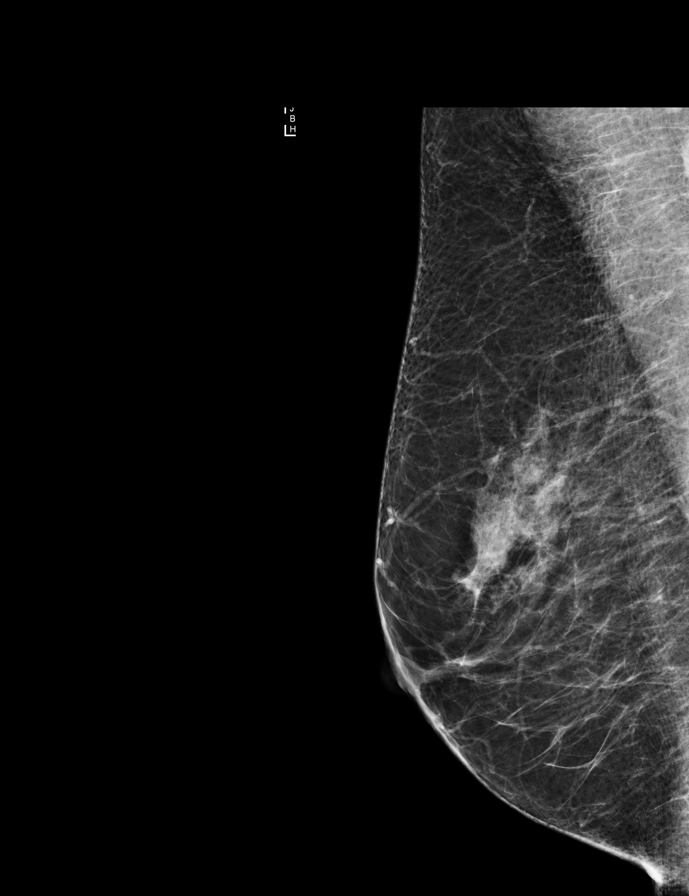

[R CC synth-2D]
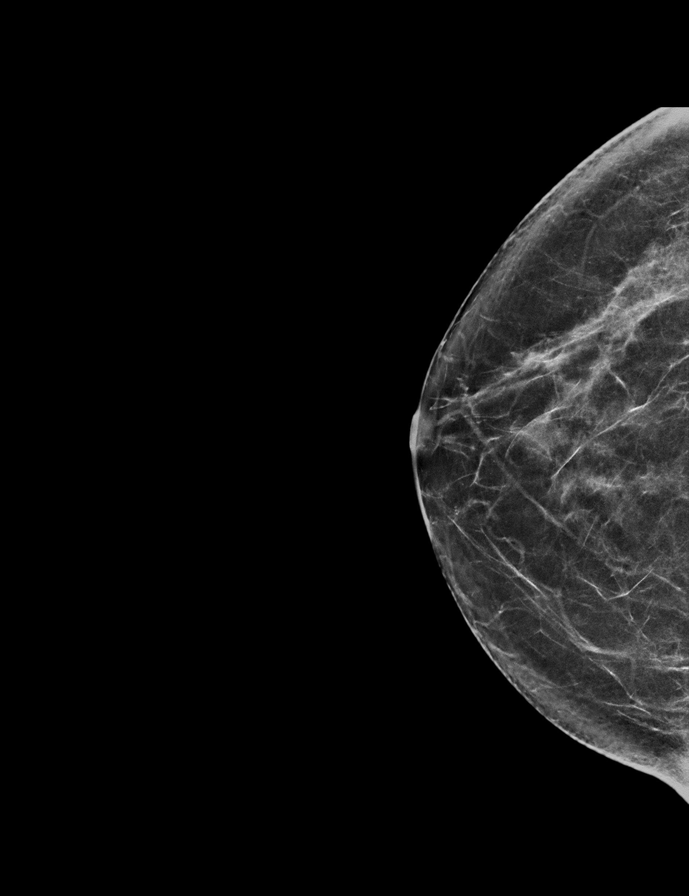

[L MLO synth-2D]
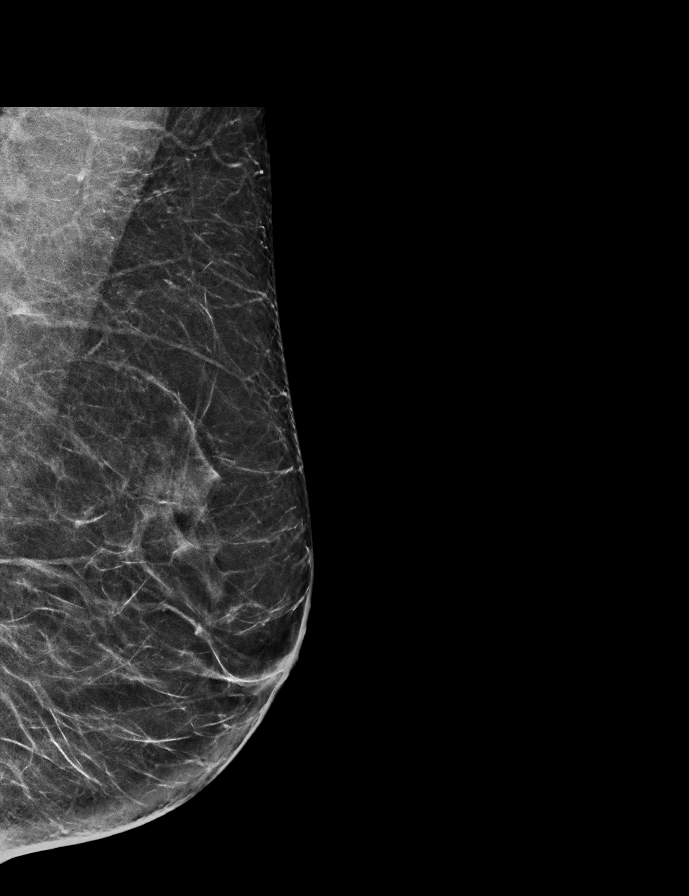

[L CC]
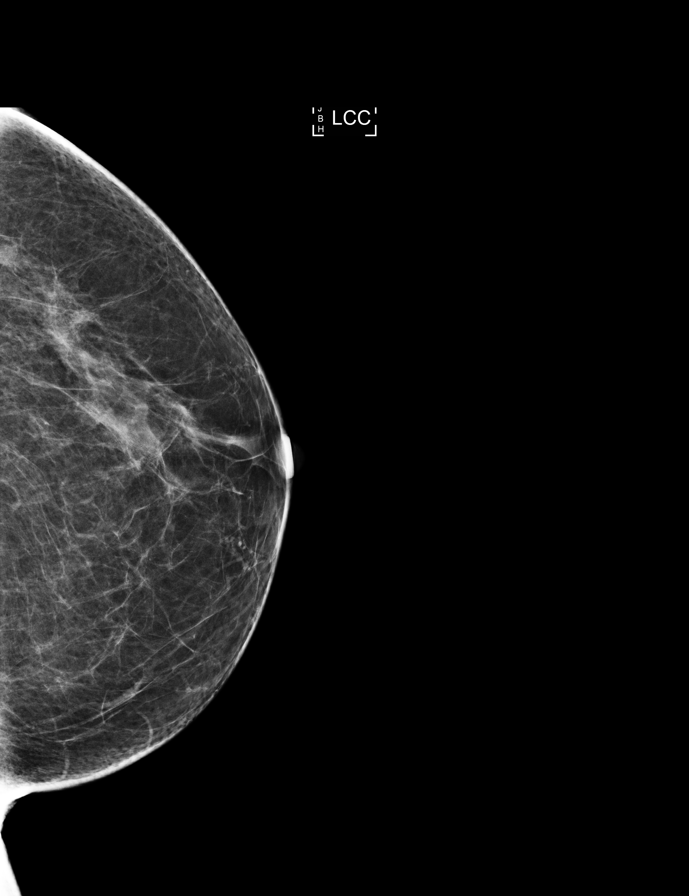

[R CC tomo · tomo slice 33/64.0]
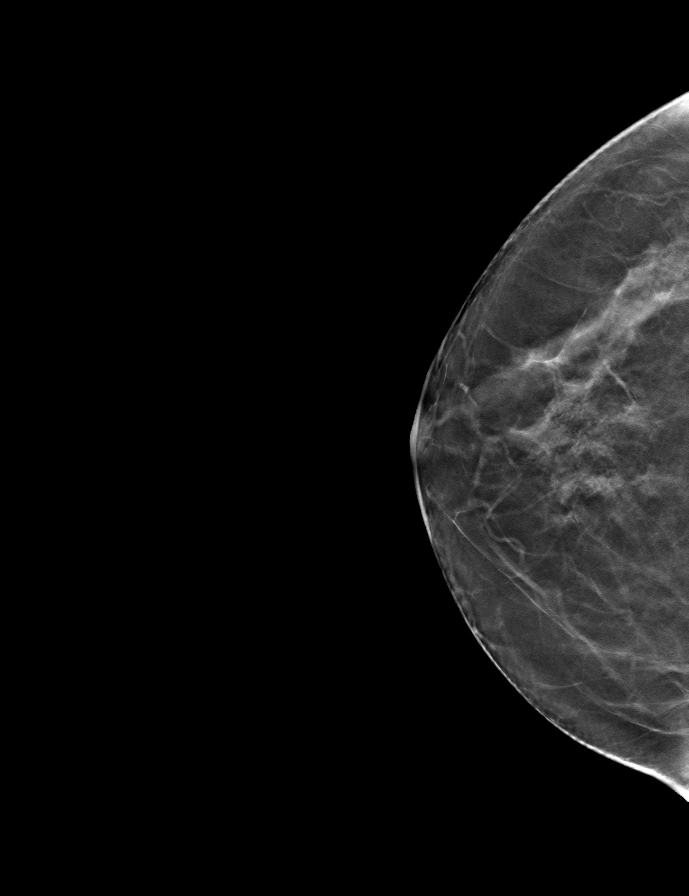

[9 of 28 positions shown; findings below may reference images not displayed]

ACR Breast Density Category b: There are scattered areas of
fibroglandular density.
FINDINGS: There are no findings suspicious for malignancy. Images were
processed with CAD.
IMPRESSION: No mammographic evidence of malignancy. A result letter of this
screening mammogram will be mailed directly to the patient.

RECOMMENDATION:
Screening mammogram in one year. (Code:97-6-RS4)

BI-RADS CATEGORY  1: Negative.

## 2019-09-17 ENCOUNTER — Other Ambulatory Visit: Payer: Self-pay | Admitting: Internal Medicine

## 2019-09-17 DIAGNOSIS — Z1231 Encounter for screening mammogram for malignant neoplasm of breast: Secondary | ICD-10-CM

## 2019-10-09 ENCOUNTER — Ambulatory Visit
Admission: RE | Admit: 2019-10-09 | Discharge: 2019-10-09 | Disposition: A | Discharge status: Home / Self Care | Payer: BC Managed Care – PPO | Source: Ambulatory Visit | Attending: Internal Medicine | Admitting: Internal Medicine

## 2019-10-09 DIAGNOSIS — Z1231 Encounter for screening mammogram for malignant neoplasm of breast: Secondary | ICD-10-CM | POA: Diagnosis present

## 2020-04-13 ENCOUNTER — Ambulatory Visit (INDEPENDENT_AMBULATORY_CARE_PROVIDER_SITE_OTHER): Payer: BC Managed Care – PPO | Admitting: Dermatology

## 2020-04-13 ENCOUNTER — Other Ambulatory Visit: Payer: Self-pay

## 2020-04-13 DIAGNOSIS — L578 Other skin changes due to chronic exposure to nonionizing radiation: Secondary | ICD-10-CM

## 2020-04-13 DIAGNOSIS — Z85828 Personal history of other malignant neoplasm of skin: Secondary | ICD-10-CM | POA: Diagnosis not present

## 2020-04-13 DIAGNOSIS — L719 Rosacea, unspecified: Secondary | ICD-10-CM | POA: Diagnosis not present

## 2020-04-13 NOTE — Progress Notes (Signed)
   Follow-Up Visit   Subjective  Chelsea Patton is a 69 y.o. female who presents for the following: recheck hx of BCC (R nasal dorsum, Mohs 07/2019).   The following portions of the chart were reviewed this encounter and updated as appropriate:      Review of Systems:  No other skin or systemic complaints except as noted in HPI or Assessment and Plan.  Objective  Well appearing patient in no apparent distress; mood and affect are within normal limits.  A focused examination was performed including face. Relevant physical exam findings are noted in the Assessment and Plan.  Objective  R nasal dorsum: Upper nasal dorsum scar clear with telangiectatic erythema  Objective  face: Erythema cheeks and nose, inflammatory paps x 2 L cheek   Assessment & Plan    Actinic Damage - diffuse scaly erythematous macules with underlying dyspigmentation - Recommend daily broad spectrum sunscreen SPF 30+ to sun-exposed areas, reapply every 2 hours as needed.  - Call for new or changing lesions.   History of basal cell carcinoma (BCC) R nasal dorsum  Clear. Observe for recurrence. Call clinic for new or changing lesions.  Recommend regular skin exams, daily broad-spectrum spf 30+ sunscreen use, and photoprotection.     Discussed BBL to lighten redness in fall/winter  Rosacea face  Mild, pt defers treatment at this time Continue sunscreen on face daily  Return in about 6 months (around 10/14/2020) for TBSE.  I, Othelia Pulling, RMA, am acting as scribe for [name], MD .  Documentation: I have reviewed the above documentation for accuracy and completeness, and I agree with the above.  [name] MD

## 2020-08-31 ENCOUNTER — Ambulatory Visit: Payer: Self-pay | Attending: Internal Medicine

## 2020-08-31 DIAGNOSIS — Z23 Encounter for immunization: Secondary | ICD-10-CM

## 2020-08-31 NOTE — Progress Notes (Signed)
   Covid-19 Vaccination Clinic  Name:  Rosangela Fehrenbach    MRN: [id number] DOB: 1951/04/19  08/31/2020  Ms. Huffins was observed post Covid-19 immunization for 15 minutes without incident. She was provided with Vaccine Information Sheet and instruction to access the V-Safe system.   Ms. Fannie Knee was instructed to call 911 with any severe reactions post vaccine: Marland Kitchen Difficulty breathing  . Swelling of face and throat  . A fast heartbeat  . A bad rash all over body  . Dizziness and weakness

## 2020-10-02 ENCOUNTER — Other Ambulatory Visit: Payer: Self-pay | Admitting: Internal Medicine

## 2020-10-02 DIAGNOSIS — Z1231 Encounter for screening mammogram for malignant neoplasm of breast: Secondary | ICD-10-CM

## 2020-10-13 ENCOUNTER — Ambulatory Visit (INDEPENDENT_AMBULATORY_CARE_PROVIDER_SITE_OTHER): Payer: BC Managed Care – PPO | Admitting: Dermatology

## 2020-10-13 ENCOUNTER — Encounter: Payer: Self-pay | Admitting: Dermatology

## 2020-10-13 ENCOUNTER — Other Ambulatory Visit: Payer: Self-pay

## 2020-10-13 DIAGNOSIS — L821 Other seborrheic keratosis: Secondary | ICD-10-CM

## 2020-10-13 DIAGNOSIS — L819 Disorder of pigmentation, unspecified: Secondary | ICD-10-CM | POA: Diagnosis not present

## 2020-10-13 DIAGNOSIS — D2272 Melanocytic nevi of left lower limb, including hip: Secondary | ICD-10-CM

## 2020-10-13 DIAGNOSIS — L814 Other melanin hyperpigmentation: Secondary | ICD-10-CM

## 2020-10-13 DIAGNOSIS — D229 Melanocytic nevi, unspecified: Secondary | ICD-10-CM | POA: Diagnosis not present

## 2020-10-13 DIAGNOSIS — Z86018 Personal history of other benign neoplasm: Secondary | ICD-10-CM

## 2020-10-13 DIAGNOSIS — Z1283 Encounter for screening for malignant neoplasm of skin: Secondary | ICD-10-CM | POA: Diagnosis not present

## 2020-10-13 DIAGNOSIS — D1800 Hemangioma unspecified site: Secondary | ICD-10-CM

## 2020-10-13 DIAGNOSIS — Z85828 Personal history of other malignant neoplasm of skin: Secondary | ICD-10-CM

## 2020-10-13 DIAGNOSIS — D485 Neoplasm of uncertain behavior of skin: Secondary | ICD-10-CM

## 2020-10-13 DIAGNOSIS — D225 Melanocytic nevi of trunk: Secondary | ICD-10-CM

## 2020-10-13 DIAGNOSIS — L578 Other skin changes due to chronic exposure to nonionizing radiation: Secondary | ICD-10-CM

## 2020-10-13 NOTE — Progress Notes (Signed)
Follow-Up Visit   Subjective  Chelsea Patton is a 69 y.o. female who presents for the following: Annual Exam (TBSE). She has a history of BCCs of the nose.  No spots of concern today.  She has a mole in the scalp that gets tender and irritated.   The following portions of the chart were reviewed this encounter and updated as appropriate:      Review of Systems:  No other skin or systemic complaints except as noted in HPI or Assessment and Plan.  Objective  Well appearing patient in no apparent distress; mood and affect are within normal limits.  A full examination was performed including scalp, head, eyes, ears, nose, lips, neck, chest, axillae, abdomen, back, buttocks, bilateral upper extremities, bilateral lower extremities, hands, feet, fingers, toes, fingernails, and toenails. All findings within normal limits unless otherwise noted below.  Objective  R nasal dorsum: Well healed scar and telangiectasias with no evidence of recurrence.   Objective  R spinal mid back: Hyperpigmented patch.  Objective  Left Mid Back: 5.0 x 2.79mm medium brown macule L mid back  Left Lower Calf: 2.7mm medium brown macule  Left Dorsum of Foot: 1.23mm dark brown macule *2 brown macules noted on left foot dorsum from old chart notes in 2013.  Images    Objective  L occipital hairline: 5.18mm flesh papule   Assessment & Plan  History of basal cell carcinoma (BCC) R nasal dorsum  Discussed laser treatment (BBL) to reduce redness if desired, not covered by insurance. $350 per treatment for the entire face.  Multiple treatments may be needed for best results  Pigmentation abnormality of skin R spinal mid back  PIH secondary to rubbing/scratching  Discussed topical steroid if itchy. Not bothersome to patient.  Observe.  Nevus (3) Left Lower Calf; Left Mid Back; Left Dorsum of Foot  Neoplasm of uncertain behavior of skin L occipital hairline  Epidermal / dermal  shaving  Lesion diameter (cm):  0.6 Informed consent: discussed and consent obtained   Patient was prepped and draped in usual sterile fashion: Area prepped with alcohol. Anesthesia: the lesion was anesthetized in a standard fashion   Anesthetic:  1% lidocaine w/ epinephrine 1-100,000 buffered w/ 8.4% NaHCO3 Instrument used: flexible razor blade   Hemostasis achieved with: pressure, aluminum chloride and electrodesiccation   Outcome: patient tolerated procedure well   Post-procedure details: wound care instructions given   Post-procedure details comment:  Ointment and small bandage applied  Specimen 1 - Surgical pathology Differential Diagnosis: Irritated Nevus vs other Check Margins: No 5.73mm flesh papule   Skin cancer screening performed today.  Actinic Damage - chronic, secondary to cumulative UV radiation exposure/sun exposure over time - diffuse scaly erythematous macules with underlying dyspigmentation - Recommend daily broad spectrum sunscreen SPF 30+ to sun-exposed areas, reapply every 2 hours as needed.  - Call for new or changing lesions.  Melanocytic Nevi - Tan-brown and/or pink-flesh-colored symmetric macules and papules, including left occipital hairline - Benign appearing on exam today - Observation - Call clinic for new or changing moles - Recommend daily use of broad spectrum spf 30+ sunscreen to sun-exposed areas.   History of Dysplastic Nevi - No evidence of recurrence today - Recommend regular full body skin exams - Recommend daily broad spectrum sunscreen SPF 30+ to sun-exposed areas, reapply every 2 hours as needed.  - Call if any new or changing lesions are noted between office visits  Hemangiomas - Red papules - Discussed benign nature -  Observe - Call for any changes  Lentigines - Scattered tan macules - Discussed due to sun exposure - Benign, observe - Call for any changes  Seborrheic Keratoses - Stuck-on, waxy, tan-brown papules and  plaques  - Discussed benign etiology and prognosis. - Observe - Call for any changes   Return in about 1 year (around 10/13/2021) for TBSE.   IJamesetta Orleans, CMA, am acting as scribe for [name], MD .  Documentation: I have reviewed the above documentation for accuracy and completeness, and I agree with the above.  [name] MD

## 2020-10-13 NOTE — Patient Instructions (Addendum)
Wound Care Instructions  1. Cleanse wound gently with soap and water once a day then pat dry with clean gauze. Apply a thing coat of Petrolatum (petroleum jelly, "Vaseline") over the wound (unless you have an allergy to this). We recommend that you use a new, sterile tube of Vaseline. Do not pick or remove scabs. Do not remove the yellow or white "healing tissue" from the base of the wound.  2. Cover the wound with fresh, clean, nonstick gauze and secure with paper tape. You may use Band-Aids in place of gauze and tape if the would is small enough, but would recommend trimming much of the tape off as there is often too much. Sometimes Band-Aids can irritate the skin.  3. You should call the office for your biopsy report after 1 week if you have not already been contacted.  4. If you experience any problems, such as abnormal amounts of bleeding, swelling, significant bruising, significant pain, or evidence of infection, please call the office immediately.   Melanoma ABCDEs  Melanoma is the most dangerous type of skin cancer, and is the leading cause of death from skin disease.  You are more likely to develop melanoma if you:  Have light-colored skin, light-colored eyes, or red or blond hair  Spend a lot of time in the sun  Tan regularly, either outdoors or in a tanning bed  Have had blistering sunburns, especially during childhood  Have a close family member who has had a melanoma  Have atypical moles or large birthmarks  Early detection of melanoma is key since treatment is typically straightforward and cure rates are extremely high if we catch it early.   The first sign of melanoma is often a change in a mole or a new dark spot.  The ABCDE system is a way of remembering the signs of melanoma.  A for asymmetry:  The two halves do not match. B for border:  The edges of the growth are irregular. C for color:  A mixture of colors are present instead of an even brown color. D for  diameter:  Melanomas are usually (but not always) greater than 8mm - the size of a pencil eraser. E for evolution:  The spot keeps changing in size, shape, and color.  Please check your skin once per month between visits. You can use a small mirror in front and a large mirror behind you to keep an eye on the back side or your body.   If you see any new or changing lesions before your next follow-up, please call to schedule a visit.  Please continue daily skin protection including broad spectrum sunscreen SPF 30+ to sun-exposed areas, reapplying every 2 hours as needed when you're outdoors.

## 2020-10-19 ENCOUNTER — Telehealth: Payer: Self-pay

## 2020-10-19 NOTE — Telephone Encounter (Signed)
Pt informed of results. She had no concerns.

## 2020-10-19 NOTE — Telephone Encounter (Signed)
Left message on voicemail to return my call.  

## 2020-10-19 NOTE — Telephone Encounter (Signed)
-----   Message from [name], MD sent at 10/19/2020  2:20 PM EST ----- Skin , L occipital hairline NEUROFIBROMA, IRRITATED  benign

## 2020-11-09 ENCOUNTER — Other Ambulatory Visit: Payer: Self-pay

## 2020-11-09 ENCOUNTER — Ambulatory Visit
Admission: RE | Admit: 2020-11-09 | Discharge: 2020-11-09 | Disposition: A | Discharge status: Home / Self Care | Payer: BC Managed Care – PPO | Source: Ambulatory Visit | Attending: Internal Medicine | Admitting: Internal Medicine

## 2020-11-09 DIAGNOSIS — Z1231 Encounter for screening mammogram for malignant neoplasm of breast: Secondary | ICD-10-CM | POA: Insufficient documentation

## 2021-10-11 ENCOUNTER — Other Ambulatory Visit: Payer: Self-pay | Admitting: Internal Medicine

## 2021-10-11 DIAGNOSIS — Z1231 Encounter for screening mammogram for malignant neoplasm of breast: Secondary | ICD-10-CM

## 2021-10-25 ENCOUNTER — Ambulatory Visit (INDEPENDENT_AMBULATORY_CARE_PROVIDER_SITE_OTHER): Payer: Medicare Other | Admitting: Dermatology

## 2021-10-25 ENCOUNTER — Other Ambulatory Visit: Payer: Self-pay

## 2021-10-25 DIAGNOSIS — Z86018 Personal history of other benign neoplasm: Secondary | ICD-10-CM

## 2021-10-25 DIAGNOSIS — L719 Rosacea, unspecified: Secondary | ICD-10-CM

## 2021-10-25 DIAGNOSIS — D2272 Melanocytic nevi of left lower limb, including hip: Secondary | ICD-10-CM

## 2021-10-25 DIAGNOSIS — D225 Melanocytic nevi of trunk: Secondary | ICD-10-CM | POA: Diagnosis not present

## 2021-10-25 DIAGNOSIS — L578 Other skin changes due to chronic exposure to nonionizing radiation: Secondary | ICD-10-CM

## 2021-10-25 DIAGNOSIS — L820 Inflamed seborrheic keratosis: Secondary | ICD-10-CM | POA: Diagnosis not present

## 2021-10-25 DIAGNOSIS — Z85828 Personal history of other malignant neoplasm of skin: Secondary | ICD-10-CM | POA: Diagnosis not present

## 2021-10-25 DIAGNOSIS — L819 Disorder of pigmentation, unspecified: Secondary | ICD-10-CM

## 2021-10-25 DIAGNOSIS — L821 Other seborrheic keratosis: Secondary | ICD-10-CM

## 2021-10-25 DIAGNOSIS — D1800 Hemangioma unspecified site: Secondary | ICD-10-CM

## 2021-10-25 DIAGNOSIS — Z1283 Encounter for screening for malignant neoplasm of skin: Secondary | ICD-10-CM | POA: Diagnosis not present

## 2021-10-25 DIAGNOSIS — D229 Melanocytic nevi, unspecified: Secondary | ICD-10-CM

## 2021-10-25 MED ORDER — METRONIDAZOLE 0.75 % EX CREA: TOPICAL_CREAM | Freq: Every day | CUTANEOUS | 6 refills | Status: AC

## 2021-10-25 NOTE — Progress Notes (Signed)
Follow-Up Visit   Subjective  Chelsea Patton is a 70 y.o. female who presents for the following: Total body skin exam (Hx of BCC R nasal dorsum, R nose hx of Dysplastic Nevi).  She has a couple of irritated growths that she picks at.   The following portions of the chart were reviewed this encounter and updated as appropriate:       Review of Systems:  No other skin or systemic complaints except as noted in HPI or Assessment and Plan.  Objective  Well appearing patient in no apparent distress; mood and affect are within normal limits.  A full examination was performed including scalp, head, eyes, ears, nose, lips, neck, chest, axillae, abdomen, back, buttocks, bilateral upper extremities, bilateral lower extremities, hands, feet, fingers, toes, fingernails, and toenails. All findings within normal limits unless otherwise noted below.  L dorsum foot; L lower calf; L mid back  L dorsum foot 1.109mm dark brown macule, stable when compared to phto  L lower calf 2.69mm med brown macule  L mid back 5.0 x 2.22mm speckled brown macule  face Telangiectasias nose and cheeks, rare scattered inflammatory paps cheeks/forehead  R spinal mid back Hyperpigmented patch  R sternum x 1, L post thigh x 1, Total = 2 (2) Erythematous keratotic or waxy stuck-on papule   Assessment & Plan   Seborrheic Keratoses - Stuck-on, waxy, tan-brown papules and/or plaques  - Benign-appearing - Discussed benign etiology and prognosis. - Observe - Call for any changes - legs, abdomen  Melanocytic Nevi - Tan-brown and/or pink-flesh-colored symmetric macules and papules - Benign appearing on exam today - Observation - Call clinic for new or changing moles - Recommend daily use of broad spectrum spf 30+ sunscreen to sun-exposed areas.  - back, shoulders  Hemangiomas - Red papules - Discussed benign nature - Observe - Call for any changes - arms, legs, abdomen  Actinic Damage - Chronic  condition, secondary to cumulative UV/sun exposure - diffuse scaly erythematous macules with underlying dyspigmentation - Recommend daily broad spectrum sunscreen SPF 30+ to sun-exposed areas, reapply every 2 hours as needed.  - Staying in the shade or wearing long sleeves, sun glasses (UVA+UVB protection) and wide brim hats (4-inch brim around the entire circumference of the hat) are also recommended for sun protection.  - Call for new or changing lesions.  Skin cancer screening performed today.  History of Basal Cell Carcinoma of the Skin - No evidence of recurrence today - Recommend regular full body skin exams - Recommend daily broad spectrum sunscreen SPF 30+ to sun-exposed areas, reapply every 2 hours as needed.  - Call if any new or changing lesions are noted between office visits  - R nasal dorsum, R nose  History of Dysplastic Nevi - No evidence of recurrence today - Recommend regular full body skin exams - Recommend daily broad spectrum sunscreen SPF 30+ to sun-exposed areas, reapply every 2 hours as needed.  - Call if any new or changing lesions are noted between office visits  - mid upper back spinal, L upper back paraspinal, L mid back lat inf braline area  Nevus (3) L dorsum foot; L lower calf; L mid back  Benign-appearing.  Observation.  Call clinic for new or changing moles.  Recommend daily use of broad spectrum spf 30+ sunscreen to sun-exposed areas.    Rosacea face  With flare Rosacea is a chronic progressive skin condition usually affecting the face of adults, causing redness and/or acne bumps. It is  treatable but not curable. It sometimes affects the eyes (ocular rosacea) as well. It may respond to topical and/or systemic medication and can flare with stress, sun exposure, alcohol, exercise and some foods.  Daily application of broad spectrum spf 30+ sunscreen to face is recommended to reduce flares.  Start Metronidazole cr 0.75% qhs  metroNIDAZOLE (METROCREAM)  0.75 % cream - face Apply topically at bedtime. Qhs to face for Rosacea  Post-inflammatory pigmentary changes R spinal mid back  Not bothersome, benign, observe  Inflamed seborrheic keratosis R sternum x 1, L post thigh x 1, Total = 2  Destruction of lesion - R sternum x 1, L post thigh x 1, Total = 2  Destruction method: cryotherapy   Informed consent: discussed and consent obtained   Lesion destroyed using liquid nitrogen: Yes   Region frozen until ice ball extended beyond lesion: Yes   Outcome: patient tolerated procedure well with no complications   Post-procedure details: wound care instructions given   Additional details:  Prior to procedure, discussed risks of blister formation, small wound, skin dyspigmentation, or rare scar following cryotherapy. Recommend Vaseline ointment to treated areas while healing.   Return in about 1 year (around 10/25/2022) for TBSE, Hx of BCC, Hx of Dysplastic nevi.  I, Othelia Pulling, RMA, am acting as scribe for [name], MD .  Documentation: I have reviewed the above documentation for accuracy and completeness, and I agree with the above.  [name] MD

## 2021-10-25 NOTE — Patient Instructions (Signed)

## 2021-11-16 ENCOUNTER — Ambulatory Visit
Admission: RE | Admit: 2021-11-16 | Discharge: 2021-11-16 | Disposition: A | Discharge status: Home / Self Care | Payer: Medicare Other | Source: Ambulatory Visit | Attending: Internal Medicine | Admitting: Internal Medicine

## 2021-11-16 ENCOUNTER — Other Ambulatory Visit: Payer: Self-pay

## 2021-11-16 DIAGNOSIS — Z1231 Encounter for screening mammogram for malignant neoplasm of breast: Secondary | ICD-10-CM | POA: Diagnosis not present

## 2022-10-03 ENCOUNTER — Other Ambulatory Visit: Payer: Self-pay | Admitting: Internal Medicine

## 2022-10-03 DIAGNOSIS — Z1231 Encounter for screening mammogram for malignant neoplasm of breast: Secondary | ICD-10-CM

## 2022-10-24 ENCOUNTER — Ambulatory Visit: Payer: Medicare Other | Admitting: Dermatology

## 2022-10-24 VITALS — BP 138/75 | HR 76

## 2022-10-24 DIAGNOSIS — D225 Melanocytic nevi of trunk: Secondary | ICD-10-CM | POA: Diagnosis not present

## 2022-10-24 DIAGNOSIS — L918 Other hypertrophic disorders of the skin: Secondary | ICD-10-CM

## 2022-10-24 DIAGNOSIS — D2272 Melanocytic nevi of left lower limb, including hip: Secondary | ICD-10-CM

## 2022-10-24 DIAGNOSIS — Z86018 Personal history of other benign neoplasm: Secondary | ICD-10-CM

## 2022-10-24 DIAGNOSIS — Z85828 Personal history of other malignant neoplasm of skin: Secondary | ICD-10-CM

## 2022-10-24 DIAGNOSIS — D229 Melanocytic nevi, unspecified: Secondary | ICD-10-CM

## 2022-10-24 DIAGNOSIS — R202 Paresthesia of skin: Secondary | ICD-10-CM

## 2022-10-24 DIAGNOSIS — Z1283 Encounter for screening for malignant neoplasm of skin: Secondary | ICD-10-CM | POA: Diagnosis not present

## 2022-10-24 DIAGNOSIS — L820 Inflamed seborrheic keratosis: Secondary | ICD-10-CM

## 2022-10-24 DIAGNOSIS — L821 Other seborrheic keratosis: Secondary | ICD-10-CM

## 2022-10-24 DIAGNOSIS — D2262 Melanocytic nevi of left upper limb, including shoulder: Secondary | ICD-10-CM | POA: Diagnosis not present

## 2022-10-24 DIAGNOSIS — D2261 Melanocytic nevi of right upper limb, including shoulder: Secondary | ICD-10-CM

## 2022-10-24 DIAGNOSIS — L814 Other melanin hyperpigmentation: Secondary | ICD-10-CM

## 2022-10-24 DIAGNOSIS — L578 Other skin changes due to chronic exposure to nonionizing radiation: Secondary | ICD-10-CM

## 2022-10-24 NOTE — Patient Instructions (Addendum)
Cryotherapy Aftercare  Wash gently with soap and water everyday.   Apply Vaseline and Band-Aid daily until healed.     Due to recent changes in healthcare laws, you may see results of your pathology and/or laboratory studies on MyChart before the doctors have had a chance to review them. We understand that in some cases there may be results that are confusing or concerning to you. Please understand that not all results are received at the same time and often the doctors may need to interpret multiple results in order to provide you with the best plan of care or course of treatment. Therefore, we ask that you please give us 2 business days to thoroughly review all your results before contacting the office for clarification. Should we see a critical lab result, you will be contacted sooner.   If You Need Anything After Your Visit  If you have any questions or concerns for your doctor, please call our main line at [phone] and press option 4 to reach your doctor's medical assistant. If no one answers, please leave a voicemail as directed and we will return your call as soon as possible. Messages left after 4 pm will be answered the following business day.   You may also send us a message via MyChart. We typically respond to MyChart messages within 1-2 business days.  For prescription refills, please ask your pharmacy to contact our office. Our fax number is [phone].  If you have an urgent issue when the clinic is closed that cannot wait until the next business day, you can page your doctor at the number below.    Please note that while we do our best to be available for urgent issues outside of office hours, we are not available 24/7.   If you have an urgent issue and are unable to reach us, you may choose to seek medical care at your doctor's office, retail clinic, urgent care center, or emergency room.  If you have a medical emergency, please immediately call 911 or go to the  emergency department.  Pager Numbers  - Dr. Kowalski: [phone]  - Dr. Moye: [phone]  - Dr. Stewart: [phone]  In the event of inclement weather, please call our main line at [phone] for an update on the status of any delays or closures.  Dermatology Medication Tips: Please keep the boxes that topical medications come in in order to help keep track of the instructions about where and how to use these. Pharmacies typically print the medication instructions only on the boxes and not directly on the medication tubes.   If your medication is too expensive, please contact our office at [phone] option 4 or send us a message through MyChart.   We are unable to tell what your co-pay for medications will be in advance as this is different depending on your insurance coverage. However, we may be able to find a substitute medication at lower cost or fill out paperwork to get insurance to cover a needed medication.   If a prior authorization is required to get your medication covered by your insurance company, please allow us 1-2 business days to complete this process.  Drug prices often vary depending on where the prescription is filled and some pharmacies may offer cheaper prices.  The website www.goodrx.com contains coupons for medications through different pharmacies. The prices here do not account for what the cost may be with help from insurance (it may be cheaper with your insurance), but the website can   give you the price if you did not use any insurance.  - You can print the associated coupon and take it with your prescription to the pharmacy.  - You may also stop by our office during regular business hours and pick up a GoodRx coupon card.  - If you need your prescription sent electronically to a different pharmacy, notify our office through [facility] MyChart or by phone at [phone] option 4.     Si Usted Necesita Algo Despus de Su Visita  Tambin puede  enviarnos un mensaje a travs de MyChart. Por lo general respondemos a los mensajes de MyChart en el transcurso de 1 a 2 das hbiles.  Para renovar recetas, por favor pida a su farmacia que se ponga en contacto con nuestra oficina. Nuestro nmero de fax es el [phone].  Si tiene un asunto urgente cuando la clnica est cerrada y que no puede esperar hasta el siguiente da hbil, puede llamar/localizar a su doctor(a) al nmero que aparece a continuacin.   Por favor, tenga en cuenta que aunque hacemos todo lo posible para estar disponibles para asuntos urgentes fuera del horario de oficina, no estamos disponibles las 24 horas del da, los 7 das de la semana.   Si tiene un problema urgente y no puede comunicarse con nosotros, puede optar por buscar atencin mdica  en el consultorio de su doctor(a), en una clnica privada, en un centro de atencin urgente o en una sala de emergencias.  Si tiene una emergencia mdica, por favor llame inmediatamente al 911 o vaya a la sala de emergencias.  Nmeros de bper  - Dr. Kowalski: [phone]  - Dra. Moye: [phone]  - Dra. Stewart: [phone]  En caso de inclemencias del tiempo, por favor llame a nuestra lnea principal al [phone] para una actualizacin sobre el estado de cualquier retraso o cierre.  Consejos para la medicacin en dermatologa: Por favor, guarde las cajas en las que vienen los medicamentos de uso tpico para ayudarle a seguir las instrucciones sobre dnde y cmo usarlos. Las farmacias generalmente imprimen las instrucciones del medicamento slo en las cajas y no directamente en los tubos del medicamento.   Si su medicamento es muy caro, por favor, pngase en contacto con nuestra oficina llamando al [phone] y presione la opcin 4 o envenos un mensaje a travs de MyChart.   No podemos decirle cul ser su copago por los medicamentos por adelantado ya que esto es diferente dependiendo de la cobertura de su seguro.  Sin embargo, es posible que podamos encontrar un medicamento sustituto a menor costo o llenar un formulario para que el seguro cubra el medicamento que se considera necesario.   Si se requiere una autorizacin previa para que su compaa de seguros cubra su medicamento, por favor permtanos de 1 a 2 das hbiles para completar este proceso.  Los precios de los medicamentos varan con frecuencia dependiendo del lugar de dnde se surte la receta y alguna farmacias pueden ofrecer precios ms baratos.  El sitio web www.goodrx.com tiene cupones para medicamentos de diferentes farmacias. Los precios aqu no tienen en cuenta lo que podra costar con la ayuda del seguro (puede ser ms barato con su seguro), pero el sitio web puede darle el precio si no utiliz ningn seguro.  - Puede imprimir el cupn correspondiente y llevarlo con su receta a la farmacia.  - Tambin puede pasar por nuestra oficina durante el horario de atencin regular y recoger una tarjeta de cupones de GoodRx.  -   Si necesita que su receta se enve electrnicamente a una farmacia diferente, informe a nuestra oficina a travs de MyChart de  o por telfono llamando al [phone] y presione la opcin 4.  

## 2022-10-24 NOTE — Progress Notes (Signed)
Follow-Up Visit   Subjective  Chelsea Patton is a 71 y.o. female who presents for the following: Total body skin exam (Hx of BCCs, hx of Dysplastic Nevi), Rosacea (Face, not treating), and bumps (R neck, sometimes stings).  The patient presents for Total-Body Skin Exam (TBSE) for skin cancer screening and mole check.  The patient has spots, moles and lesions to be evaluated, some may be new or changing and the patient has concerns that these could be cancer.   The following portions of the chart were reviewed this encounter and updated as appropriate:       Review of Systems:  No other skin or systemic complaints except as noted in HPI or Assessment and Plan.  Objective  Well appearing patient in no apparent distress; mood and affect are within normal limits.  A full examination was performed including scalp, head, eyes, ears, nose, lips, neck, chest, axillae, abdomen, back, buttocks, bilateral upper extremities, bilateral lower extremities, hands, feet, fingers, toes, fingernails, and toenails. All findings within normal limits unless otherwise noted below.  L dorsum foot; L lower calf; L mid back  L dorsum foot 1.53m dark brown macule  L lower calf 2.069mmed brown macule  L mid back 5.0 x 2.34m29mpeckled brown macule  R neck x 1 Stuck on waxy paps with erythema  paraspinal Hyperpigmented patch    Assessment & Plan   Lentigines - Scattered tan macules - Due to sun exposure - Benign-appearing, observe - Recommend daily broad spectrum sunscreen SPF 30+ to sun-exposed areas, reapply every 2 hours as needed. - Call for any changes - upper back  Seborrheic Keratoses - Stuck-on, waxy, tan-brown papules and/or plaques  - Benign-appearing - Discussed benign etiology and prognosis. - Observe - Call for any changes - neck, arms, abdomen  Melanocytic Nevi - Tan-brown and/or pink-flesh-colored symmetric macules and papules - Benign appearing on exam today -  Observation - Call clinic for new or changing moles - Recommend daily use of broad spectrum spf 30+ sunscreen to sun-exposed areas.  - back, chest, arms  Hemangiomas - Red papules - Discussed benign nature - Observe - Call for any changes - chest  Actinic Damage - Chronic condition, secondary to cumulative UV/sun exposure - diffuse scaly erythematous macules with underlying dyspigmentation - Recommend daily broad spectrum sunscreen SPF 30+ to sun-exposed areas, reapply every 2 hours as needed.  - Staying in the shade or wearing long sleeves, sun glasses (UVA+UVB protection) and wide brim hats (4-inch brim around the entire circumference of the hat) are also recommended for sun protection.  - Call for new or changing lesions. - chest  Skin cancer screening performed today.   History of Basal Cell Carcinoma of the Skin - No evidence of recurrence today - Recommend regular full body skin exams - Recommend daily broad spectrum sunscreen SPF 30+ to sun-exposed areas, reapply every 2 hours as needed.  - Call if any new or changing lesions are noted between office visits  - R nose, R nasal dorsum  History of Dysplastic Nevi - No evidence of recurrence today - Recommend regular full body skin exams - Recommend daily broad spectrum sunscreen SPF 30+ to sun-exposed areas, reapply every 2 hours as needed.  - Call if any new or changing lesions are noted between office visits  - mid upper back spinal, L upper back paraspinal, L mid back lat inf braline  Acrochordons (Skin Tags) - Fleshy, skin-colored pedunculated papules - Benign appearing.  - Observe. -  If desired, they can be removed with an in office procedure that is not covered by insurance. - Please call the clinic if you notice any new or changing lesions.  - neck  Nevus (3) L dorsum foot; L lower calf; L mid back  Benign-appearing. Stable compared to previous visit. Observation.  Call clinic for new or changing moles.   Recommend daily use of broad spectrum spf 30+ sunscreen to sun-exposed areas.    Inflamed seborrheic keratosis R neck x 1  Symptomatic, irritating, patient would like treated.   Destruction of lesion - R neck x 1  Destruction method: cryotherapy   Informed consent: discussed and consent obtained   Lesion destroyed using liquid nitrogen: Yes   Region frozen until ice ball extended beyond lesion: Yes   Outcome: patient tolerated procedure well with no complications   Post-procedure details: wound care instructions given   Additional details:  Prior to procedure, discussed risks of blister formation, small wound, skin dyspigmentation, or rare scar following cryotherapy. Recommend Vaseline ointment to treated areas while healing.   Notalgia paresthetica paraspinal  Stable with PIH Notalgia paresthetica is a chronic condition affecting the skin of the back in which a pinched nerve along the spine causes itching or changes in sensation in an area of skin. This is usually accompanied by chronic rubbing or scratching often leaving the area of skin discolored and thickened. There is no cure, but there are some treatments which may help control the itch.   Over the counter (non-prescription) treatments for notalgia paresthetica include numbing creams like pramoxine or lidocaine which temporarily reduce itch or Capsaicin-containing creams which cause a burning sensation but which sometimes over time will reset the nerves to stop producing itch.  If you choose to use Capsaicin cream, it is recommended to use it 5 times daily for 1 week followed by 3 times daily for 3-6 weeks. You may have to continue using it long-term. For severe cases, there are some prescription cream or pill options which may help. Other treatment options include: - Transcutaneous Electrical Nerve Stimulation (TENS) - Gabapentin 300-900 mg daily po - Amitriptyline orally - Paravertebral local anesthetic block - intralesional  Botulinum toxin A   Return in about 1 year (around 10/25/2023) for TBSE, Hx of BCC, Hx of Dysplastic nevi.  I, Othelia Pulling, RMA, am acting as scribe for Chelsea Patty, MD .  Documentation: I have reviewed the above documentation for accuracy and completeness, and I agree with the above.  Chelsea Patty MD

## 2022-11-29 ENCOUNTER — Ambulatory Visit
Admission: RE | Admit: 2022-11-29 | Discharge: 2022-11-29 | Disposition: A | Discharge status: Home / Self Care | Payer: Medicare Other | Source: Ambulatory Visit | Attending: Internal Medicine | Admitting: Internal Medicine

## 2022-11-29 DIAGNOSIS — Z1231 Encounter for screening mammogram for malignant neoplasm of breast: Secondary | ICD-10-CM | POA: Insufficient documentation

## 2023-08-17 ENCOUNTER — Ambulatory Visit: Payer: Medicare Other

## 2023-08-17 DIAGNOSIS — Z86010 Personal history of colonic polyps: Secondary | ICD-10-CM | POA: Diagnosis not present

## 2023-08-17 DIAGNOSIS — Z09 Encounter for follow-up examination after completed treatment for conditions other than malignant neoplasm: Secondary | ICD-10-CM | POA: Diagnosis present

## 2023-08-17 DIAGNOSIS — K640 First degree hemorrhoids: Secondary | ICD-10-CM | POA: Diagnosis not present

## 2023-10-31 ENCOUNTER — Ambulatory Visit: Payer: Medicare Other | Admitting: Dermatology

## 2023-10-31 DIAGNOSIS — Z86018 Personal history of other benign neoplasm: Secondary | ICD-10-CM

## 2023-10-31 DIAGNOSIS — D3611 Benign neoplasm of peripheral nerves and autonomic nervous system of face, head, and neck: Secondary | ICD-10-CM | POA: Diagnosis not present

## 2023-10-31 DIAGNOSIS — Z1283 Encounter for screening for malignant neoplasm of skin: Secondary | ICD-10-CM | POA: Diagnosis not present

## 2023-10-31 DIAGNOSIS — L820 Inflamed seborrheic keratosis: Secondary | ICD-10-CM

## 2023-10-31 DIAGNOSIS — D492 Neoplasm of unspecified behavior of bone, soft tissue, and skin: Secondary | ICD-10-CM | POA: Diagnosis not present

## 2023-10-31 DIAGNOSIS — D2261 Melanocytic nevi of right upper limb, including shoulder: Secondary | ICD-10-CM

## 2023-10-31 DIAGNOSIS — L578 Other skin changes due to chronic exposure to nonionizing radiation: Secondary | ICD-10-CM | POA: Diagnosis not present

## 2023-10-31 DIAGNOSIS — L814 Other melanin hyperpigmentation: Secondary | ICD-10-CM

## 2023-10-31 DIAGNOSIS — D489 Neoplasm of uncertain behavior, unspecified: Secondary | ICD-10-CM

## 2023-10-31 DIAGNOSIS — W908XXA Exposure to other nonionizing radiation, initial encounter: Secondary | ICD-10-CM | POA: Diagnosis not present

## 2023-10-31 DIAGNOSIS — R202 Paresthesia of skin: Secondary | ICD-10-CM

## 2023-10-31 DIAGNOSIS — L719 Rosacea, unspecified: Secondary | ICD-10-CM

## 2023-10-31 DIAGNOSIS — L821 Other seborrheic keratosis: Secondary | ICD-10-CM

## 2023-10-31 DIAGNOSIS — Z85828 Personal history of other malignant neoplasm of skin: Secondary | ICD-10-CM

## 2023-10-31 DIAGNOSIS — D229 Melanocytic nevi, unspecified: Secondary | ICD-10-CM

## 2023-10-31 DIAGNOSIS — D2262 Melanocytic nevi of left upper limb, including shoulder: Secondary | ICD-10-CM

## 2023-10-31 DIAGNOSIS — D1801 Hemangioma of skin and subcutaneous tissue: Secondary | ICD-10-CM

## 2023-10-31 NOTE — Patient Instructions (Addendum)

## 2023-10-31 NOTE — Progress Notes (Signed)
Follow-Up Visit   Subjective  Chelsea Patton is a 72 y.o. female who presents for the following: Skin Cancer Screening and Full Body Skin Exam Hx of bcc, hx of dysplastic, hx of nevus   Patient also reports a spot at chin areas she would like removed or treated and a spot at left side that bothers her.    The patient presents for Total-Body Skin Exam (TBSE) for skin cancer screening and mole check. The patient has spots, moles and lesions to be evaluated, some may be new or changing and the patient may have concern these could be cancer.    The following portions of the chart were reviewed this encounter and updated as appropriate: medications, allergies, medical history  Review of Systems:  No other skin or systemic complaints except as noted in HPI or Assessment and Plan.  Objective  Well appearing patient in no apparent distress; mood and affect are within normal limits.  A full examination was performed including scalp, head, eyes, ears, nose, lips, neck, chest, axillae, abdomen, back, buttocks, bilateral upper extremities, bilateral lower extremities, hands, feet, fingers, toes, fingernails, and toenails. All findings within normal limits unless otherwise noted below.   Relevant physical exam findings are noted in the Assessment and Plan.  left lower flank x 1 Erythematous stuck-on, waxy papule or plaque  inferior chin 3 mm flesh papule          Assessment & Plan   SKIN CANCER SCREENING PERFORMED TODAY.  ACTINIC DAMAGE - Chronic condition, secondary to cumulative UV/sun exposure - diffuse scaly erythematous macules with underlying dyspigmentation - Recommend daily broad spectrum sunscreen SPF 30+ to sun-exposed areas, reapply every 2 hours as needed.  - Staying in the shade or wearing long sleeves, sun glasses (UVA+UVB protection) and wide brim hats (4-inch brim around the entire circumference of the hat) are also recommended for sun protection.  - Call for  new or changing lesions.  LENTIGINES, SEBORRHEIC KERATOSES, HEMANGIOMAS - Benign normal skin lesions - Benign-appearing - Call for any changes  MELANOCYTIC NEVI - Tan-brown and/or pink-flesh-colored symmetric macules and papules, including regular brown macules on b/l shoulder and upper arms - Benign appearing on exam today - Observation - Call clinic for new or changing moles - Recommend daily use of broad spectrum spf 30+ sunscreen to sun-exposed areas.   Nevus  L dorsum foot 1.65mm dark brown macule, photo compared, no change  left dorsum foot base of 4th toe 2 mm brown macule,  photo compared, no change    L lower calf 2.60mm med brown macule   L mid back 5.0 x 2.26mm speckled brown macule  Right preauricular  2.5 mm medium brown macule   Benign-appearing. Stable compared to previous visit. Observation.  Call clinic for new or changing moles.  Recommend daily use of broad spectrum spf 30+ sunscreen to sun-exposed areas.    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 Currently well-controlled. Stable with post-inflammatory hyperpimentation (PIH)   Notalgia paresthetica is a chronic condition affecting the skin of the back in which a pinched nerve along the spine causes itching or changes in sensation in an area of skin. This is usually accompanied by chronic rubbing or scratching often leaving the area of skin discolored and thickened. There is no cure, but there are some treatments which may help control the itch.   Over the counter (non-prescription) treatments for notalgia  paresthetica include numbing creams like pramoxine or lidocaine which temporarily reduce itch or Capsaicin-containing creams which cause a burning sensation but which sometimes over time will reset the nerves to stop producing itch.  If you choose to use Capsaicin cream, it is recommended to use it 5 times  daily for 1 week followed by 3 times daily for 3-6 weeks. You may have to continue using it long-term. For severe cases, there are some prescription cream or pill options which may help. Other treatment options include: - Transcutaneous Electrical Nerve Stimulation (TENS) - Gabapentin 300-900 mg daily po - Amitriptyline orally - Paravertebral local anesthetic block - intralesional Botulinum toxin A  Treatment Plan:  Observation. 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 Currently well-controlled without treatment.    Rosacea is a chronic progressive skin condition usually affecting the face of adults, causing redness and/or acne bumps. It is treatable but not curable. It sometimes affects the eyes (ocular rosacea) as well. It may respond to topical and/or systemic medication and can flare with stress, sun exposure, alcohol, exercise, topical steroids (including hydrocortisone/cortisone 10) and some foods.  Daily application of broad spectrum spf 30+ sunscreen to face is recommended to reduce flares.  Patient denies grittiness of the eyes  Treatment Plan Patient defers treatment  Discussed BBL laser treatment for redness    HISTORY OF BASAL CELL CARCINOMA OF THE SKIN Most recent Right nasal dorsum 07/2019 Other areas see history  - No evidence of recurrence today - Recommend regular full body skin exams - Recommend daily broad spectrum sunscreen SPF 30+ to sun-exposed areas, reapply every 2 hours as needed.  - Call if any new or changing lesions are noted between office visits  HISTORY OF DYSPLASTIC NEVUS Multiple areas see history  No evidence of recurrence today Recommend regular full body skin exams Recommend daily broad spectrum sunscreen SPF 30+ to sun-exposed areas, reapply every 2 hours as needed.  Call if any new or changing lesions are noted between  office visits  Inflamed seborrheic keratosis left lower flank x 1  Symptomatic, irritating, patient would like treated.  Destruction of lesion - left lower flank x 1  Destruction method: cryotherapy   Informed consent: discussed and consent obtained   Timeout:  patient name, date of birth, surgical site, and procedure verified Lesion destroyed using liquid nitrogen: Yes   Region frozen until ice ball extended beyond lesion: Yes   Outcome: patient tolerated procedure well with no complications   Post-procedure details: wound care instructions given   Additional details:  Prior to procedure, discussed risks of blister formation, small wound, skin dyspigmentation, or rare scar following cryotherapy. Recommend Vaseline ointment to treated areas while healing.   Neoplasm of uncertain behavior inferior chin  Epidermal / dermal shaving  Lesion diameter (cm):  0.3 Informed consent: discussed and consent obtained   Patient was prepped and draped in usual sterile fashion: Area prepped with alcohol. Anesthesia: the lesion was anesthetized in a standard fashion   Anesthetic:  1% lidocaine w/ epinephrine 1-100,000 buffered w/ 8.4% NaHCO3 Instrument used: flexible razor blade   Hemostasis achieved with: pressure, aluminum chloride and electrodesiccation   Outcome: patient tolerated procedure well   Post-procedure details: wound care instructions given   Post-procedure details comment:  Ointment and small bandage applied.   Specimen 1 - Surgical pathology Differential Diagnosis: Irritated nevus vs other  Check Margins: No  Irritated nevus vs other   Return in about 1 year (around 10/30/2024) for TBSE.  I, Asher Muir, CMA, am acting as scribe for Willeen Niece, MD.   Documentation: I have reviewed the above documentation for accuracy and completeness, and I agree with the above.  Willeen Niece, MD

## 2023-11-06 LAB — SURGICAL PATHOLOGY

## 2023-11-07 ENCOUNTER — Telehealth: Payer: Self-pay

## 2023-11-07 NOTE — Progress Notes (Signed)
Complete/sh

## 2023-11-07 NOTE — Telephone Encounter (Signed)
Advised pt of bx results/sh ?

## 2023-11-07 NOTE — Telephone Encounter (Signed)
-----   Message from Willeen Niece sent at 11/06/2023  6:40 PM EST ----- 1. Skin, inferior chin :       NEUROFIBROMA, BASE INVOLVED   Benign growth of nerve "insulation" in skin, may recur  - please call patient

## 2023-11-29 ENCOUNTER — Other Ambulatory Visit: Payer: Self-pay | Admitting: Internal Medicine

## 2023-11-29 DIAGNOSIS — Z1231 Encounter for screening mammogram for malignant neoplasm of breast: Secondary | ICD-10-CM

## 2023-12-01 ENCOUNTER — Ambulatory Visit
Admission: RE | Admit: 2023-12-01 | Discharge: 2023-12-01 | Disposition: A | Discharge status: Home / Self Care | Payer: Medicare Other | Source: Ambulatory Visit | Attending: Internal Medicine | Admitting: Internal Medicine

## 2023-12-01 DIAGNOSIS — Z1231 Encounter for screening mammogram for malignant neoplasm of breast: Secondary | ICD-10-CM | POA: Insufficient documentation

## 2024-11-05 ENCOUNTER — Encounter: Payer: Self-pay | Admitting: Dermatology

## 2024-11-05 ENCOUNTER — Ambulatory Visit: Payer: Medicare Other | Admitting: Dermatology

## 2024-11-05 DIAGNOSIS — L578 Other skin changes due to chronic exposure to nonionizing radiation: Secondary | ICD-10-CM

## 2024-11-05 DIAGNOSIS — D229 Melanocytic nevi, unspecified: Secondary | ICD-10-CM

## 2024-11-05 DIAGNOSIS — L719 Rosacea, unspecified: Secondary | ICD-10-CM

## 2024-11-05 DIAGNOSIS — Z86018 Personal history of other benign neoplasm: Secondary | ICD-10-CM

## 2024-11-05 DIAGNOSIS — Z85828 Personal history of other malignant neoplasm of skin: Secondary | ICD-10-CM

## 2024-11-05 DIAGNOSIS — L814 Other melanin hyperpigmentation: Secondary | ICD-10-CM

## 2024-11-05 DIAGNOSIS — L821 Other seborrheic keratosis: Secondary | ICD-10-CM

## 2024-11-05 DIAGNOSIS — D1801 Hemangioma of skin and subcutaneous tissue: Secondary | ICD-10-CM

## 2024-11-05 DIAGNOSIS — Z1283 Encounter for screening for malignant neoplasm of skin: Secondary | ICD-10-CM

## 2024-11-05 NOTE — Patient Instructions (Addendum)
 Recommend daily broad spectrum sunscreen SPF 30+ to sun-exposed areas, reapply every 2 hours as needed. Call for new or changing lesions.  Staying in the shade or wearing long sleeves, sun glasses (UVA+UVB protection) and wide brim hats (4-inch brim around the entire circumference of the hat) are also recommended for sun protection.      Melanoma ABCDEs  Melanoma is the most dangerous type of skin cancer, and is the leading cause of death from skin disease.  You are more likely to develop melanoma if you: Have light-colored skin, light-colored eyes, or red or blond hair Spend a lot of time in the sun Tan regularly, either outdoors or in a tanning bed Have had blistering sunburns, especially during childhood Have a close family member who has had a melanoma Have atypical moles or large birthmarks  Early detection of melanoma is key since treatment is typically straightforward and cure rates are extremely high if we catch it early.   The first sign of melanoma is often a change in a mole or a new dark spot.  The ABCDE system is a way of remembering the signs of melanoma.  A for asymmetry:  The two halves do not match. B for border:  The edges of the growth are irregular. C for color:  A mixture of colors are present instead of an even brown color. D for diameter:  Melanomas are usually (but not always) greater than 6mm - the size of a pencil eraser. E for evolution:  The spot keeps changing in size, shape, and color.  Please check your skin once per month between visits. You can use a small mirror in front and a large mirror behind you to keep an eye on the back side or your body.   If you see any new or changing lesions before your next follow-up, please call to schedule a visit.  Please continue daily skin protection including broad spectrum sunscreen SPF 30+ to sun-exposed areas, reapplying every 2 hours as needed when you're outdoors.   Staying in the shade or wearing long sleeves,  sun glasses (UVA+UVB protection) and wide brim hats (4-inch brim around the entire circumference of the hat) are also recommended for sun protection.     Counseling for BBL / IPL / Laser and Coordination of Care Discussed the treatment option of Broad Band Light (BBL) /Intense Pulsed Light (IPL)/ Laser for skin discoloration, including brown spots and redness.  Typically we recommend at least 1-3 treatment sessions about 5-8 weeks apart for best results.  Cannot have tanned skin when BBL performed, and regular use of sunscreen/photoprotection is advised after the procedure to help maintain results. The patient's condition may also require maintenance treatments in the future.  The fee for BBL / laser treatments is $350 per treatment session for the whole face.  A fee can be quoted for other parts of the body.  Insurance typically does not pay for BBL/laser treatments and therefore the fee is an out-of-pocket cost. Recommend prophylactic valtrex treatment. Once scheduled for procedure, will send Rx in prior to patient's appointment.      Due to recent changes in healthcare laws, you may see results of your pathology and/or laboratory studies on MyChart before the doctors have had a chance to review them. We understand that in some cases there may be results that are confusing or concerning to you. Please understand that not all results are received at the same time and often the doctors may need to interpret multiple  results in order to provide you with the best plan of care or course of treatment. Therefore, we ask that you please give us  2 business days to thoroughly review all your results before contacting the office for clarification. Should we see a critical lab result, you will be contacted sooner.   If You Need Anything After Your Visit  If you have any questions or concerns for your doctor, please call our main line at [phone] and press option 4 to reach your doctor's medical assistant.  If no one answers, please leave a voicemail as directed and we will return your call as soon as possible. Messages left after 4 pm will be answered the following business day.   You may also send us  a message via MyChart. We typically respond to MyChart messages within 1-2 business days.  For prescription refills, please ask your pharmacy to contact our office. Our fax number is [phone].  If you have an urgent issue when the clinic is closed that cannot wait until the next business day, you can page your doctor at the number below.    Please note that while we do our best to be available for urgent issues outside of office hours, we are not available 24/7.   If you have an urgent issue and are unable to reach us , you may choose to seek medical care at your doctor's office, retail clinic, urgent care center, or emergency room.  If you have a medical emergency, please immediately call 911 or go to the emergency department.  Pager Numbers  - Dr. Hester: [phone]  - Dr. Jackquline: [phone]  - Dr. Claudene: [phone]   - Dr. Raymund: [phone]  In the event of inclement weather, please call our main line at [phone] for an update on the status of any delays or closures.  Dermatology Medication Tips: Please keep the boxes that topical medications come in in order to help keep track of the instructions about where and how to use these. Pharmacies typically print the medication instructions only on the boxes and not directly on the medication tubes.   If your medication is too expensive, please contact our office at [phone] option 4 or send us  a message through MyChart.   We are unable to tell what your co-pay for medications will be in advance as this is different depending on your insurance coverage. However, we may be able to find a substitute medication at lower cost or fill out paperwork to get insurance to cover a needed medication.   If a prior authorization is  required to get your medication covered by your insurance company, please allow us  1-2 business days to complete this process.  Drug prices often vary depending on where the prescription is filled and some pharmacies may offer cheaper prices.  The website www.goodrx.com contains coupons for medications through different pharmacies. The prices here do not account for what the cost may be with help from insurance (it may be cheaper with your insurance), but the website can give you the price if you did not use any insurance.  - You can print the associated coupon and take it with your prescription to the pharmacy.  - You may also stop by our office during regular business hours and pick up a GoodRx coupon card.  - If you need your prescription sent electronically to a different pharmacy, notify our office through [facility] or by phone at [phone] option 4.     Si Usted [employer]  Despus de Su Visita  Tambin puede enviarnos un mensaje a travs de MyChart. Por lo general respondemos a los mensajes de MyChart en el transcurso de 1 a 2 das hbiles.  Para renovar recetas, por favor pida a su farmacia que se ponga en contacto con nuestra oficina. Randi lakes de fax es [place] [phone].  Si tiene un asunto urgente cuando la clnica est cerrada y que no puede esperar hasta el siguiente da hbil, puede llamar/localizar a su doctor(a) al nmero que aparece a continuacin.   Por favor, tenga en cuenta que aunque hacemos todo lo posible para estar disponibles para asuntos urgentes fuera del horario de [place], no estamos disponibles las 24 horas del da, los 7 [address]  [address] de la [place].   Si tiene un problema urgente y no puede comunicarse con nosotros, puede optar por buscar atencin mdica  en el consultorio de su doctor(a), en una clnica privada, en un centro de atencin urgente o en una sala de emergencias.  Si tiene engineer, drilling, por favor llame inmediatamente al 911 o vaya a  la sala de emergencias.  Nmeros de bper  - Dr. Hester: [phone]  - Dra. Jackquline: [phone]  - Dr. Claudene: [phone]  - Dra. Kitts: [phone]  En caso de inclemencias del [place], por favor llame a nuestra lnea principal al [phone] para una actualizacin sobre el estado de cualquier retraso o cierre.  Consejos para la medicacin en dermatologa: Por favor, guarde las cajas en las que vienen los medicamentos de uso tpico para ayudarle a seguir las instrucciones sobre dnde y cmo usarlos. Las farmacias generalmente imprimen las instrucciones del medicamento slo en las cajas y no directamente en los tubos del [place].   Si su medicamento es muy caro, por favor, pngase en contacto con landry rieger llamando al [phone] y presione la opcin 4 o envenos un mensaje a travs de Clinical Cytogeneticist.   No podemos decirle cul ser su copago por los medicamentos por adelantado ya que esto es diferente dependiendo de la cobertura de su seguro. Sin embargo, es posible que podamos encontrar un medicamento sustituto a audiological scientist un formulario para que el seguro cubra el medicamento que se considera necesario.   Si se requiere una autorizacin previa para que su compaa de seguros cubra su medicamento, por favor permtanos de 1 a 2 das hbiles para completar este proceso.  Los precios de los medicamentos varan con frecuencia dependiendo del environmental consultant de dnde se surte la receta y alguna farmacias pueden ofrecer precios ms baratos.  El sitio web www.goodrx.com tiene cupones para medicamentos de health and safety inspector. Los precios aqu no tienen en cuenta lo que podra costar con la ayuda del seguro (puede ser ms barato con su seguro), pero el sitio web puede darle el precio si no utiliz tourist information centre manager.  - Puede imprimir el cupn correspondiente y llevarlo con su receta a la farmacia.  - Tambin puede pasar por nuestra oficina durante el horario de atencin regular y education officer, museum  una tarjeta de cupones de GoodRx.  - Si necesita que su receta se enve electrnicamente a una farmacia diferente, informe a nuestra oficina a travs de MyChart de [place] o por telfono llamando al [phone] y presione la opcin 4.

## 2024-11-05 NOTE — Progress Notes (Signed)
 Follow-Up Visit   Subjective  Chelsea Patton is a 73 y.o. female who presents for the following: Skin Cancer Screening and Full Body Skin Exam Hx of bcc, hx of dysplastic nevus.  The patient presents for Total-Body Skin Exam (TBSE) for skin cancer screening and mole check. The patient has spots, moles and lesions to be evaluated, some may be new or changing and the patient may have concern these could be cancer.    The following portions of the chart were reviewed this encounter and updated as appropriate: medications, allergies, medical history  Review of Systems:  No other skin or systemic complaints except as noted in HPI or Assessment and Plan.  Objective  Well appearing patient in no apparent distress; mood and affect are within normal limits.  A full examination was performed including scalp, head, eyes, ears, nose, lips, neck, chest, axillae, abdomen, back, buttocks, bilateral upper extremities, bilateral lower extremities, hands, feet, fingers, toes, fingernails, and toenails. All findings within normal limits unless otherwise noted below.   Relevant physical exam findings are noted in the Assessment and Plan.     Assessment & Plan   SKIN CANCER SCREENING PERFORMED TODAY.  ACTINIC DAMAGE - Chronic condition, secondary to cumulative UV/sun exposure - diffuse scaly erythematous macules with underlying dyspigmentation - Recommend daily broad spectrum sunscreen SPF 30+ to sun-exposed areas, reapply every 2 hours as needed.  - Staying in the shade or wearing long sleeves, sun glasses (UVA+UVB protection) and wide brim hats (4-inch brim around the entire circumference of the hat) are also recommended for sun protection.  - Call for new or changing lesions.  LENTIGINES, SEBORRHEIC KERATOSES, HEMANGIOMAS - Benign normal skin lesions - Benign-appearing - Call for any changes  MELANOCYTIC NEVI - Tan-brown and/or pink-flesh-colored symmetric macules and papules, including  regular brown macules on b/l shoulder and upper arms - L dorsum foot 1 mm dark brown macule, photo compared, no change - left dorsum foot base of 4th toe 2 mm brown macule,  photo compared, no change   - L lower calf 2 mm med brown macule  - L mid back 5 x 2 mm speckled brown macule - Right preauricular 2.5 mm medium brown macule  - Benign appearing on exam today, Stable. - Observation - Call clinic for new or changing moles - Recommend daily use of broad spectrum spf 30+ sunscreen to sun-exposed areas.    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 Currently well-controlled without treatment.    Rosacea is a chronic progressive skin condition usually affecting the face of adults, causing redness and/or acne bumps. It is treatable but not curable. It sometimes affects the eyes (ocular rosacea) as well. It may respond to topical and/or systemic medication and can flare with stress, sun exposure, alcohol, exercise, topical steroids (including hydrocortisone/cortisone 10) and some foods.  Daily application of broad spectrum spf 30+ sunscreen to face is recommended to reduce flares.  Patient denies grittiness of the eyes  Treatment Plan Patient defers treatment  Discussed BBL laser treatment for redness   SEBORRHEIC KERATOSIS - Stuck-on, waxy, tan-brown papules and/or plaques at left thigh, right calf - Benign-appearing - Discussed benign etiology and prognosis. - Observe - Call for any changes    HISTORY OF BASAL CELL CARCINOMA OF THE SKIN Most recent Right nasal dorsum 07/2019 Other areas see history  - No evidence of recurrence today - Recommend regular full body skin exams -  Recommend daily broad spectrum sunscreen SPF 30+ to sun-exposed areas, reapply every 2 hours as needed.  - Call if any new or changing lesions are noted between office visits  HISTORY OF DYSPLASTIC NEVUS Multiple areas  see history  No evidence of recurrence today Recommend regular full body skin exams Recommend daily broad spectrum sunscreen SPF 30+ to sun-exposed areas, reapply every 2 hours as needed.  Call if any new or changing lesions are noted between office visits    Return in about 1 year (around 11/05/2025) for TBSE, HxBCC, HxDN.  I, Jill Parcell, CMA, am acting as scribe for Rexene Rattler, MD.    Documentation: I have reviewed the above documentation for accuracy and completeness, and I agree with the above.  Rexene Rattler, MD

## 2024-11-11 ENCOUNTER — Other Ambulatory Visit: Payer: Self-pay | Admitting: Internal Medicine

## 2024-11-11 DIAGNOSIS — Z1231 Encounter for screening mammogram for malignant neoplasm of breast: Secondary | ICD-10-CM

## 2024-12-02 ENCOUNTER — Ambulatory Visit
Admission: RE | Admit: 2024-12-02 | Discharge: 2024-12-02 | Disposition: A | Discharge status: Home / Self Care | Source: Ambulatory Visit | Attending: Internal Medicine | Admitting: Internal Medicine

## 2024-12-02 DIAGNOSIS — Z1231 Encounter for screening mammogram for malignant neoplasm of breast: Secondary | ICD-10-CM | POA: Diagnosis present

## 2025-12-01 ENCOUNTER — Encounter: Admitting: Dermatology
